# Patient Record
Sex: Female | Born: 1980 | Hispanic: Yes | Marital: Married | Smoking: Never smoker
Health system: Southern US, Community
[De-identification: ages and names within clinical notes are randomized; demographics above are authoritative.]

## PROBLEM LIST (undated history)

## (undated) ENCOUNTER — Inpatient Hospital Stay (HOSPITAL_COMMUNITY): Payer: Self-pay

## (undated) DIAGNOSIS — B89 Unspecified parasitic disease: Secondary | ICD-10-CM

## (undated) HISTORY — PX: APPENDECTOMY: SHX54

## (undated) HISTORY — DX: Unspecified parasitic disease: B89

---

## 2011-06-25 ENCOUNTER — Emergency Department (HOSPITAL_COMMUNITY)
Admission: EM | Admit: 2011-06-25 | Discharge: 2011-06-26 | Disposition: A | Discharge status: Home / Self Care | Payer: PRIVATE HEALTH INSURANCE | Attending: Emergency Medicine | Admitting: Emergency Medicine

## 2011-06-25 ENCOUNTER — Encounter (HOSPITAL_COMMUNITY): Payer: Self-pay | Admitting: *Deleted

## 2011-06-25 DIAGNOSIS — IMO0001 Reserved for inherently not codable concepts without codable children: Secondary | ICD-10-CM | POA: Insufficient documentation

## 2011-06-25 DIAGNOSIS — M542 Cervicalgia: Secondary | ICD-10-CM | POA: Insufficient documentation

## 2011-06-25 DIAGNOSIS — R509 Fever, unspecified: Secondary | ICD-10-CM | POA: Insufficient documentation

## 2011-06-25 DIAGNOSIS — R05 Cough: Secondary | ICD-10-CM | POA: Insufficient documentation

## 2011-06-25 DIAGNOSIS — R5381 Other malaise: Secondary | ICD-10-CM | POA: Insufficient documentation

## 2011-06-25 DIAGNOSIS — R070 Pain in throat: Secondary | ICD-10-CM | POA: Insufficient documentation

## 2011-06-25 DIAGNOSIS — R110 Nausea: Secondary | ICD-10-CM | POA: Insufficient documentation

## 2011-06-25 DIAGNOSIS — R059 Cough, unspecified: Secondary | ICD-10-CM | POA: Insufficient documentation

## 2011-06-25 DIAGNOSIS — J069 Acute upper respiratory infection, unspecified: Secondary | ICD-10-CM | POA: Insufficient documentation

## 2011-06-25 DIAGNOSIS — M2550 Pain in unspecified joint: Secondary | ICD-10-CM | POA: Insufficient documentation

## 2011-06-25 DIAGNOSIS — R51 Headache: Secondary | ICD-10-CM | POA: Insufficient documentation

## 2011-06-25 DIAGNOSIS — J3489 Other specified disorders of nose and nasal sinuses: Secondary | ICD-10-CM | POA: Insufficient documentation

## 2011-06-25 DIAGNOSIS — R5383 Other fatigue: Secondary | ICD-10-CM | POA: Insufficient documentation

## 2011-06-25 DIAGNOSIS — R630 Anorexia: Secondary | ICD-10-CM | POA: Insufficient documentation

## 2011-06-25 DIAGNOSIS — H9209 Otalgia, unspecified ear: Secondary | ICD-10-CM | POA: Insufficient documentation

## 2011-06-25 MED ORDER — MORPHINE SULFATE 4 MG/ML IJ SOLN
6.0000 mg | Freq: Once | INTRAMUSCULAR | Status: AC
  Administered 2011-06-26: 6 mg via INTRAVENOUS
  Filled 2011-06-25: qty 2

## 2011-06-25 MED ORDER — ONDANSETRON HCL 4 MG/2ML IJ SOLN
4.0000 mg | Freq: Once | INTRAMUSCULAR | Status: AC
  Administered 2011-06-26: 4 mg via INTRAVENOUS
  Filled 2011-06-25: qty 2

## 2011-06-25 MED ORDER — SODIUM CHLORIDE 0.9 % IV BOLUS (SEPSIS)
2000.0000 mL | Freq: Once | INTRAVENOUS | Status: AC
  Administered 2011-06-26: 2000 mL via INTRAVENOUS

## 2011-06-25 NOTE — ED Notes (Signed)
Pt has had headache, fever, productive cough, and malaise for the past 24 hours.  Pt had been in [place] and was exposed to [place] fever.  Pt reported a temperature of 103F at 1800 and took 1000mg  of tylenol to bring it down.  Temperature now 99.8.

## 2011-06-25 NOTE — ED Notes (Signed)
Her last tylenol was 3-4 hours ago

## 2011-06-25 NOTE — ED Notes (Signed)
Elevated temp cough headache for 24 hours and her vision is blurred

## 2011-06-25 NOTE — ED Provider Notes (Signed)
History     CSN: [id number]  Arrival date & time 06/25/11  2055   First MD Initiated Contact with Patient 06/25/11 2312      Chief Complaint  Patient presents with  . Influenza    (Consider location/radiation/quality/duration/timing/severity/associated sxs/prior treatment)  Patient is a 31 y.o. female presenting with flu symptoms. The history is provided by the patient. No language interpreter was used.  Influenza Associated symptoms include arthralgias, chills, congestion, coughing, fatigue, a fever, headaches, myalgias and nausea. Pertinent negatives include no abdominal pain, diaphoresis, rash or vomiting.   Patient presents with headache, fever, and cough that began about 24 hours ago. She describes the cough as productive of green sputum. She reports her fever has been up to 103 at home. She has been taking Tylenol prn for fever. She describes the headache as 10/10 and bilateral, temporal. She also reports some sore throat, congestion, neck tenderness, fatigue, nausea, poor appetite, and arthralgias. She denies vomiting, abdominal pain, rash, and any bleeding. She recently returned from a trip to [place].   History reviewed. No pertinent past medical history.  History reviewed. No pertinent past surgical history.  History reviewed. No pertinent family history.  History  Substance Use Topics  . Smoking status: Never Smoker   . Smokeless tobacco: Not on file  . Alcohol Use: Yes    OB History    Grav Para Term Preterm Abortions TAB SAB Ect Mult Living                  Review of Systems  Constitutional: Positive for fever, chills, activity change, appetite change and fatigue. Negative for diaphoresis.  HENT: Positive for congestion and rhinorrhea. Negative for ear pain and nosebleeds.   Respiratory: Positive for cough. Negative for chest tightness and shortness of breath.   Gastrointestinal: Positive for nausea. Negative for vomiting, abdominal pain, diarrhea and  constipation.  Musculoskeletal: Positive for myalgias and arthralgias.  Skin: Negative for rash.  Neurological: Positive for headaches. Negative for dizziness and light-headedness.    Allergies  Ibuprofen  Home Medications   Current Outpatient Rx  Name Route Sig Dispense Refill  . ACETAMINOPHEN 500 MG PO TABS Oral Take 1,000 mg by mouth every 6 (six) hours as needed. For pain    . CORTIZONE-10 EX Apply externally Apply 1 application topically 2 (two) times daily as needed. For itching    . ADULT MULTIVITAMIN W/MINERALS CH Oral Take 1 tablet by mouth daily.    Marland Kitchen OVER THE COUNTER MEDICATION Oral Take 2 tablets by mouth daily. Whole foods immune system booster.    Marland Kitchen OVER THE COUNTER MEDICATION Both Eyes Place 3 drops into both eyes 2 (two) times daily as needed. For dry eyes      BP 110/65  Pulse 87  Temp(Src) 99.8 F (37.7 C) (Oral)  Resp 19  SpO2 97%  Physical Exam  Constitutional: She is oriented to person, place, and time. She appears well-developed and well-nourished. No distress.  HENT:  Head: Normocephalic and atraumatic.  Right Ear: There is tenderness. Tympanic membrane is erythematous.  Left Ear: Tympanic membrane and ear canal normal.  Nose: Nose normal.  Mouth/Throat: Oropharynx is clear and moist and mucous membranes are normal.  Eyes: Conjunctivae are normal. Pupils are equal, round, and reactive to light.  Neck: Normal range of motion. Neck supple.  Cardiovascular: Normal rate, regular rhythm, normal heart sounds and intact distal pulses.   No murmur heard. Pulmonary/Chest: Effort normal and breath sounds normal. No respiratory  distress. She has no wheezes.  Abdominal: Soft. Bowel sounds are normal.  Musculoskeletal: Normal range of motion.  Neurological: She is alert and oriented to person, place, and time.  Skin: Skin is warm and dry. No rash noted. She is not diaphoretic.  Psychiatric: She has a normal mood and affect. Her behavior is normal.    ED Course   Procedures (including critical care time)    Patient is feeling better following 2 L of fluid and pain control.  She was sent home with further pain control comes in for cough.  She is advised to return here for any worsening in her condition.  She states that she fell or has pain or body aches.  The patient is told to increase her fluids at home.   Patient most likely has a viral URI based on her physical exam and history of present illness.   MDM  MDM Reviewed: nursing note and vitals Interpretation: labs and x-ray            Carlyle Dolly, PA-C 06/26/11 [phone]

## 2011-06-26 ENCOUNTER — Emergency Department (HOSPITAL_COMMUNITY): Payer: PRIVATE HEALTH INSURANCE

## 2011-06-26 LAB — BASIC METABOLIC PANEL
BUN: 8 mg/dL (ref 6–23)
CO2: 23 mEq/L (ref 19–32)
Calcium: 9 mg/dL (ref 8.4–10.5)
Chloride: 104 mEq/L (ref 96–112)
Creatinine, Ser: 0.67 mg/dL (ref 0.50–1.10)
GFR calc Af Amer: >90 mL/min (ref >90)
GFR calc non Af Amer: >90 mL/min (ref >90)
Glucose, Bld: 88 mg/dL (ref 70–99)
Potassium: 3 mEq/L — ABNORMAL LOW (ref 3.5–5.1)
Sodium: 139 mEq/L (ref 135–145)

## 2011-06-26 LAB — URINE MICROSCOPIC-ADD ON

## 2011-06-26 LAB — URINALYSIS, ROUTINE W REFLEX MICROSCOPIC
Bilirubin Urine: NEGATIVE
Glucose, UA: NEGATIVE mg/dL
Ketones, ur: >80 mg/dL — AB
Leukocytes, UA: NEGATIVE
Nitrite: NEGATIVE
Protein, ur: NEGATIVE mg/dL
Specific Gravity, Urine: 1.026 (ref 1.005–1.030)
Urobilinogen, UA: 0.2 mg/dL (ref 0.0–1.0)
pH: 6 (ref 5.0–8.0)

## 2011-06-26 LAB — CBC
HCT: 37.8 % (ref 36.0–46.0)
Hemoglobin: 12.9 g/dL (ref 12.0–15.0)
MCH: 29.9 pg (ref 26.0–34.0)
MCHC: 34.1 g/dL (ref 30.0–36.0)
MCV: 87.5 fL (ref 78.0–100.0)
Platelets: 255 10*3/uL (ref 150–400)
RBC: 4.32 MIL/uL (ref 3.87–5.11)
RDW: 12.2 % (ref 11.5–15.5)
WBC: 6.1 10*3/uL (ref 4.0–10.5)

## 2011-06-26 MED ORDER — ACETAMINOPHEN-CODEINE 120-12 MG/5ML PO SOLN: 10.0000 mL | ORAL | Status: AC | PRN

## 2011-06-26 MED ORDER — ACETAMINOPHEN 325 MG PO TABS
ORAL_TABLET | ORAL | Status: AC
  Administered 2011-06-26: 975 mg
  Filled 2011-06-26: qty 3

## 2011-06-26 MED ORDER — GUAIFENESIN ER 1200 MG PO TB12: 1.0000 | ORAL_TABLET | Freq: Two times a day (BID) | ORAL | Status: DC

## 2011-06-26 MED ORDER — ACETAMINOPHEN 325 MG PO TABS: 975.0000 mg | ORAL_TABLET | Freq: Once | ORAL | Status: AC

## 2011-06-26 MED ORDER — POTASSIUM CHLORIDE 20 MEQ/15ML (10%) PO LIQD
40.0000 meq | Freq: Once | ORAL | Status: AC
  Administered 2011-06-26: 40 meq via ORAL
  Filled 2011-06-26: qty 15

## 2011-06-26 NOTE — ED Provider Notes (Signed)
Medical screening examination/treatment/procedure(s) were conducted as a shared visit with non-physician practitioner(s) and myself.  I personally evaluated the patient during the encounter  Feels better at this time. RRR. Not c/o UTI sx. On menstrual cycle. Urine contaminated. Culture sent. Hold abx at this time. Precautions for return.  Forbes Cellar, MD 06/26/11 [phone]

## 2011-06-26 NOTE — ED Notes (Signed)
Pt is stable and in no acute distress.  She is going home with family

## 2011-06-26 NOTE — ED Notes (Signed)
Pt still unable to urinate.  Pt states she has had little to eat or drink in the past 24 hours

## 2011-06-27 LAB — URINE CULTURE
Colony Count: NO GROWTH
Culture  Setup Time: 201301291001
Culture: NO GROWTH

## 2014-06-04 DIAGNOSIS — B89 Unspecified parasitic disease: Secondary | ICD-10-CM

## 2014-06-04 HISTORY — DX: Unspecified parasitic disease: B89

## 2015-03-04 LAB — OB RESULTS CONSOLE GC/CHLAMYDIA: Gonorrhea: NEGATIVE

## 2015-04-04 ENCOUNTER — Ambulatory Visit (INDEPENDENT_AMBULATORY_CARE_PROVIDER_SITE_OTHER): Payer: BLUE CROSS/BLUE SHIELD | Admitting: Obstetrics & Gynecology

## 2015-04-04 ENCOUNTER — Encounter: Payer: Self-pay | Admitting: Obstetrics & Gynecology

## 2015-04-04 VITALS — BP 112/66 | HR 69 | Ht 67.0 in | Wt 176.0 lb

## 2015-04-04 DIAGNOSIS — K589 Irritable bowel syndrome without diarrhea: Secondary | ICD-10-CM | POA: Insufficient documentation

## 2015-04-04 DIAGNOSIS — Z113 Encounter for screening for infections with a predominantly sexual mode of transmission: Secondary | ICD-10-CM

## 2015-04-04 DIAGNOSIS — Z124 Encounter for screening for malignant neoplasm of cervix: Secondary | ICD-10-CM

## 2015-04-04 DIAGNOSIS — Z3481 Encounter for supervision of other normal pregnancy, first trimester: Secondary | ICD-10-CM | POA: Diagnosis not present

## 2015-04-04 DIAGNOSIS — Z3400 Encounter for supervision of normal first pregnancy, unspecified trimester: Secondary | ICD-10-CM | POA: Insufficient documentation

## 2015-04-04 DIAGNOSIS — Z3401 Encounter for supervision of normal first pregnancy, first trimester: Secondary | ICD-10-CM

## 2015-04-04 NOTE — Progress Notes (Signed)
   Subjective:    Bianca Hodge is a G2P0010 8273w6d being seen today for her first obstetrical visit.  Her obstetrical history is significant for h/o GI parasites, sx of IBS. Patient does intend to breast feed. Pregnancy history fully reviewed.  Patient reports nausea and vomiting.  Filed Vitals:   04/04/15 0950 04/04/15 0951  BP: 112/66   Pulse: 69   Height:  5\' 7"  (1.702 m)  Weight: 176 lb (79.833 kg)     HISTORY: OB History  Gravida Para Term Preterm AB SAB TAB Ectopic Multiple Living  2    1  1        # Outcome Date GA Lbr Len/2nd Weight Sex Delivery Anes PTL Lv  2 Current           1 TAB 2003             Past Medical History  Diagnosis Date  . Parasite infection 06-04-2014   Past Surgical History  Procedure Laterality Date  . Appendectomy     No family history on file.   Exam    Uterus:     Pelvic Exam:    Perineum: No Hemorrhoids   Vulva: normal   Vagina:  normal mucosa   pH:    Cervix: no lesions   Adnexa: normal adnexa   Bony Pelvis: average  System: Breast:  normal appearance, no masses or tenderness   Skin: normal coloration and turgor, no rashes    Neurologic: oriented, normal mood   Extremities: normal strength, tone, and muscle mass   HEENT neck supple with midline trachea and thyroid without masses   Mouth/Teeth dental hygiene good   Neck supple   Cardiovascular: regular rate and rhythm   Respiratory:  appears well, vitals normal, no respiratory distress, acyanotic, normal RR   Abdomen: soft, non-tender; bowel sounds normal; no masses,  no organomegaly   Urinary: urethral meatus normal      Assessment:    Pregnancy: G2P0010 Patient Active Problem List   Diagnosis Date Noted  . Irritable bowel syndrome (IBS) 04/04/2015  . Supervision of low-risk first pregnancy 04/04/2015        Plan:     Initial labs drawn. Prenatal vitamins. Problem list reviewed and updated. Genetic Screening discussed First Screen: ordered.Discussed CFDNA  testing  Ultrasound discussed; fetal survey: 18-20 weeks.  Follow up in 4 weeks. 50% of 30 min visit spent on counseling and coordination of care.  Psyllium ok for constipation, probiotics   [facility] 04/04/2015

## 2015-04-04 NOTE — Progress Notes (Signed)
Bedside ultrasound done. CRL 1.52 cm with gestational age of [redacted] weeks 6 days- consistent with LMP. Heart rate measured at 166 bpm.

## 2015-04-05 LAB — OBSTETRIC PANEL
Antibody Screen: NEGATIVE
Basophils Absolute: 0 10*3/uL (ref 0.0–0.1)
Basophils Relative: 0 % (ref 0–1)
Eosinophils Absolute: 0.1 10*3/uL (ref 0.0–0.7)
Eosinophils Relative: 1 % (ref 0–5)
HCT: 36.5 % (ref 36.0–46.0)
Hemoglobin: 12.2 g/dL (ref 12.0–15.0)
Hepatitis B Surface Ag: NEGATIVE
Lymphocytes Relative: 24 % (ref 12–46)
Lymphs Abs: 2 10*3/uL (ref 0.7–4.0)
MCH: 29.8 pg (ref 26.0–34.0)
MCHC: 33.4 g/dL (ref 30.0–36.0)
MCV: 89 fL (ref 78.0–100.0)
MPV: 11.3 fL (ref 8.6–12.4)
Monocytes Absolute: 0.6 10*3/uL (ref 0.1–1.0)
Monocytes Relative: 7 % (ref 3–12)
Neutro Abs: 5.6 10*3/uL (ref 1.7–7.7)
Neutrophils Relative %: 68 % (ref 43–77)
Platelets: 320 10*3/uL (ref 150–400)
RBC: 4.1 MIL/uL (ref 3.87–5.11)
RDW: 13 % (ref 11.5–15.5)
Rh Type: POSITIVE
Rubella: 0.82 Index (ref <0.90)
WBC: 8.2 10*3/uL (ref 4.0–10.5)

## 2015-04-05 LAB — GC/CHLAMYDIA PROBE AMP (~~LOC~~) NOT AT ARMC
Chlamydia: NEGATIVE
Neisseria Gonorrhea: NEGATIVE

## 2015-04-05 LAB — HIV ANTIBODY (ROUTINE TESTING W REFLEX): HIV 1&2 Ab, 4th Generation: NONREACTIVE

## 2015-04-05 LAB — SICKLE CELL SCREEN: Sickle Cell Screen: NEGATIVE

## 2015-04-06 LAB — CULTURE, URINE COMPREHENSIVE
Colony Count: NO GROWTH
Organism ID, Bacteria: NO GROWTH

## 2015-04-07 LAB — CYTOLOGY - PAP

## 2015-04-08 LAB — CYSTIC FIBROSIS DIAGNOSTIC STUDY

## 2015-04-28 ENCOUNTER — Ambulatory Visit (INDEPENDENT_AMBULATORY_CARE_PROVIDER_SITE_OTHER): Payer: BLUE CROSS/BLUE SHIELD | Admitting: Student

## 2015-04-28 VITALS — BP 108/63 | HR 60 | Temp 98.7°F | Wt 176.0 lb

## 2015-04-28 DIAGNOSIS — O99891 Other specified diseases and conditions complicating pregnancy: Secondary | ICD-10-CM | POA: Insufficient documentation

## 2015-04-28 DIAGNOSIS — O09899 Supervision of other high risk pregnancies, unspecified trimester: Secondary | ICD-10-CM | POA: Insufficient documentation

## 2015-04-28 DIAGNOSIS — Z3483 Encounter for supervision of other normal pregnancy, third trimester: Secondary | ICD-10-CM | POA: Diagnosis not present

## 2015-04-28 DIAGNOSIS — Z3401 Encounter for supervision of normal first pregnancy, first trimester: Secondary | ICD-10-CM

## 2015-04-28 DIAGNOSIS — Z2839 Other underimmunization status: Secondary | ICD-10-CM | POA: Insufficient documentation

## 2015-04-28 DIAGNOSIS — O9989 Other specified diseases and conditions complicating pregnancy, childbirth and the puerperium: Secondary | ICD-10-CM

## 2015-04-28 DIAGNOSIS — Z283 Underimmunization status: Secondary | ICD-10-CM

## 2015-04-28 LAB — POCT URINALYSIS DIP (DEVICE)
Bilirubin Urine: NEGATIVE
Glucose, UA: NEGATIVE mg/dL
Hgb urine dipstick: NEGATIVE
Ketones, ur: NEGATIVE mg/dL
Leukocytes, UA: NEGATIVE
Nitrite: NEGATIVE
Protein, ur: NEGATIVE mg/dL
Specific Gravity, Urine: 1.025 (ref 1.005–1.030)
Urobilinogen, UA: 0.2 mg/dL (ref 0.0–1.0)
pH: 7.5 (ref 5.0–8.0)

## 2015-04-28 NOTE — Patient Instructions (Addendum)
Safe Medications in Pregnancy   Acne: Benzoyl Peroxide Salicylic Acid  Backache/Headache: Tylenol: 2 regular strength every 4 hours OR              2 Extra strength every 6 hours  Colds/Coughs/Allergies: Benadryl (alcohol free) 25 mg every 6 hours as needed Breath right strips Claritin Cepacol throat lozenges Chloraseptic throat spray Cold-Eeze- up to three times per day Cough drops, alcohol free Flonase (by prescription only) Guaifenesin Mucinex Robitussin DM (plain only, alcohol free) Saline nasal spray/drops Sudafed (pseudoephedrine) & Actifed ** use only after [redacted] weeks gestation and if you do not have high blood pressure Tylenol Vicks Vaporub Zinc lozenges Zyrtec   Constipation: Colace Ducolax suppositories Fleet enema Glycerin suppositories Metamucil Milk of magnesia Miralax Senokot Smooth move tea  Diarrhea: Kaopectate Imodium A-D  *NO pepto Bismol  Hemorrhoids: Anusol Anusol HC Preparation H Tucks  Indigestion: Tums Maalox Mylanta Zantac  Pepcid  Insomnia: Benadryl (alcohol free) 25mg every 6 hours as needed Tylenol PM Unisom, no Gelcaps  Leg Cramps: Tums MagGel  Nausea/Vomiting:  Bonine Dramamine Emetrol Ginger extract Sea bands Meclizine  Nausea medication to take during pregnancy:  Unisom (doxylamine succinate 25 mg tablets) Take one tablet daily at bedtime. If symptoms are not adequately controlled, the dose can be increased to a maximum recommended dose of two tablets daily (1/2 tablet in the morning, 1/2 tablet mid-afternoon and one at bedtime). Vitamin B6 100mg tablets. Take one tablet twice a day (up to 200 mg per day).  Skin Rashes: Aveeno products Benadryl cream or 25mg every 6 hours as needed Calamine Lotion 1% cortisone cream  Yeast infection: Gyne-lotrimin 7 Monistat 7   **If taking multiple medications, please check labels to avoid duplicating the same active ingredients **take medication as directed on  the label ** Do not exceed 4000 mg of tylenol in 24 hours **Do not take medications that contain aspirin or ibuprofen    First Trimester of Pregnancy The first trimester of pregnancy is from week 1 until the end of week 12 (months 1 through 3). A week after a sperm fertilizes an egg, the egg will implant on the wall of the uterus. This embryo will begin to develop into a baby. Genes from you and your partner are forming the baby. The female genes determine whether the baby is a boy or a girl. At 6-8 weeks, the eyes and face are formed, and the heartbeat can be seen on ultrasound. At the end of 12 weeks, all the baby's organs are formed.  Now that you are pregnant, you will want to do everything you can to have a healthy baby. Two of the most important things are to get good prenatal care and to follow your health care provider's instructions. Prenatal care is all the medical care you receive before the baby's birth. This care will help prevent, find, and treat any problems during the pregnancy and childbirth. BODY CHANGES Your body goes through many changes during pregnancy. The changes vary from woman to woman.   You may gain or lose a couple of pounds at first.  You may feel sick to your stomach (nauseous) and throw up (vomit). If the vomiting is uncontrollable, call your health care provider.  You may tire easily.  You may develop headaches that can be relieved by medicines approved by your health care provider.  You may urinate more often. Painful urination may mean you have a bladder infection.  You may develop heartburn as a result of your   pregnancy.  You may develop constipation because certain hormones are causing the muscles that push waste through your intestines to slow down.  You may develop hemorrhoids or swollen, bulging veins (varicose veins).  Your breasts may begin to grow larger and become tender. Your nipples may stick out more, and the tissue that surrounds them (areola)  may become darker.  Your gums may bleed and may be sensitive to brushing and flossing.  Dark spots or blotches (chloasma, mask of pregnancy) may develop on your face. This will likely fade after the baby is born.  Your menstrual periods will stop.  You may have a loss of appetite.  You may develop cravings for certain kinds of food.  You may have changes in your emotions from day to day, such as being excited to be pregnant or being concerned that something may go wrong with the pregnancy and baby.  You may have more vivid and strange dreams.  You may have changes in your hair. These can include thickening of your hair, rapid growth, and changes in texture. Some women also have hair loss during or after pregnancy, or hair that feels dry or thin. Your hair will most likely return to normal after your baby is born. WHAT TO EXPECT AT YOUR PRENATAL VISITS During a routine prenatal visit:  You will be weighed to make sure you and the baby are growing normally.  Your blood pressure will be taken.  Your abdomen will be measured to track your baby's growth.  The fetal heartbeat will be listened to starting around week 10 or 12 of your pregnancy.  Test results from any previous visits will be discussed. Your health care provider may ask you:  How you are feeling.  If you are feeling the baby move.  If you have had any abnormal symptoms, such as leaking fluid, bleeding, severe headaches, or abdominal cramping.  If you are using any tobacco products, including cigarettes, chewing tobacco, and electronic cigarettes.  If you have any questions. Other tests that may be performed during your first trimester include:  Blood tests to find your blood type and to check for the presence of any previous infections. They will also be used to check for low iron levels (anemia) and Rh antibodies. Later in the pregnancy, blood tests for diabetes will be done along with other tests if problems  develop.  Urine tests to check for infections, diabetes, or protein in the urine.  An ultrasound to confirm the proper growth and development of the baby.  An amniocentesis to check for possible genetic problems.  Fetal screens for spina bifida and Down syndrome.  You may need other tests to make sure you and the baby are doing well.  HIV (human immunodeficiency virus) testing. Routine prenatal testing includes screening for HIV, unless you choose not to have this test. HOME CARE INSTRUCTIONS  Medicines  Follow your health care provider's instructions regarding medicine use. Specific medicines may be either safe or unsafe to take during pregnancy.  Take your prenatal vitamins as directed.  If you develop constipation, try taking a stool softener if your health care provider approves. Diet  Eat regular, well-balanced meals. Choose a variety of foods, such as meat or vegetable-based protein, fish, milk and low-fat dairy products, vegetables, fruits, and whole grain breads and cereals. Your health care provider will help you determine the amount of weight gain that is right for you.  Avoid raw meat and uncooked cheese. These carry germs that can cause   birth defects in the baby.  Eating four or five small meals rather than three large meals a day may help relieve nausea and vomiting. If you start to feel nauseous, eating a few soda crackers can be helpful. Drinking liquids between meals instead of during meals also seems to help nausea and vomiting.  If you develop constipation, eat more high-fiber foods, such as fresh vegetables or fruit and whole grains. Drink enough fluids to keep your urine clear or pale yellow. Activity and Exercise  Exercise only as directed by your health care provider. Exercising will help you:  Control your weight.  Stay in shape.  Be prepared for labor and delivery.  Experiencing pain or cramping in the lower abdomen or low back is a good sign that you  should stop exercising. Check with your health care provider before continuing normal exercises.  Try to avoid standing for long periods of time. Move your legs often if you must stand in one place for a long time.  Avoid heavy lifting.  Wear low-heeled shoes, and practice good posture.  You may continue to have sex unless your health care provider directs you otherwise. Relief of Pain or Discomfort  Wear a good support bra for breast tenderness.   Take warm sitz baths to soothe any pain or discomfort caused by hemorrhoids. Use hemorrhoid cream if your health care provider approves.   Rest with your legs elevated if you have leg cramps or low back pain.  If you develop varicose veins in your legs, wear support hose. Elevate your feet for 15 minutes, 3-4 times a day. Limit salt in your diet. Prenatal Care  Schedule your prenatal visits by the twelfth week of pregnancy. They are usually scheduled monthly at first, then more often in the last 2 months before delivery.  Write down your questions. Take them to your prenatal visits.  Keep all your prenatal visits as directed by your health care provider. Safety  Wear your seat belt at all times when driving.  Make a list of emergency phone numbers, including numbers for family, friends, the hospital, and police and fire departments. General Tips  Ask your health care provider for a referral to a local prenatal education class. Begin classes no later than at the beginning of month 6 of your pregnancy.  Ask for help if you have counseling or nutritional needs during pregnancy. Your health care provider can offer advice or refer you to specialists for help with various needs.  Do not use hot tubs, steam rooms, or saunas.  Do not douche or use tampons or scented sanitary pads.  Do not cross your legs for long periods of time.  Avoid cat litter boxes and soil used by cats. These carry germs that can cause birth defects in the baby  and possibly loss of the fetus by miscarriage or stillbirth.  Avoid all smoking, herbs, alcohol, and medicines not prescribed by your health care provider. Chemicals in these affect the formation and growth of the baby.  Do not use any tobacco products, including cigarettes, chewing tobacco, and electronic cigarettes. If you need help quitting, ask your health care provider. You may receive counseling support and other resources to help you quit.  Schedule a dentist appointment. At home, brush your teeth with a soft toothbrush and be gentle when you floss. SEEK MEDICAL CARE IF:   You have dizziness.  You have mild pelvic cramps, pelvic pressure, or nagging pain in the abdominal area.  You have persistent   nausea, vomiting, or diarrhea.  You have a bad smelling vaginal discharge.  You have pain with urination.  You notice increased swelling in your face, hands, legs, or ankles. SEEK IMMEDIATE MEDICAL CARE IF:   You have a fever.  You are leaking fluid from your vagina.  You have spotting or bleeding from your vagina.  You have severe abdominal cramping or pain.  You have rapid weight gain or loss.  You vomit blood or material that looks like coffee grounds.  You are exposed to German measles and have never had them.  You are exposed to fifth disease or chickenpox.  You develop a severe headache.  You have shortness of breath.  You have any kind of trauma, such as from a fall or a car accident.   This information is not intended to replace advice given to you by your health care provider. Make sure you discuss any questions you have with your health care provider.   Document Released: 05/08/2001 Document Revised: 06/04/2014 Document Reviewed: 03/24/2013 Elsevier Interactive Patient Education 2016 [employer]  

## 2015-04-28 NOTE — Progress Notes (Signed)
Subjective:  Bianca Hodge is a 34 y.o. G2P0010 at 9138w2d being seen today for ongoing prenatal care.  She is currently monitored for the following issues for this low-risk pregnancy and has Irritable bowel syndrome (IBS); Supervision of low-risk first pregnancy; and Rubella non-immune status, antepartum on her problem list.  Patient reports no complaints.  Contractions: Not present. Vag. Bleeding: None.  Movement: Present. Denies leaking of fluid.   The following portions of the patient's history were reviewed and updated as appropriate: allergies, current medications, past family history, past medical history, past social history, past surgical history and problem list. Problem list updated.  Objective:   Filed Vitals:   04/28/15 1521  BP: 108/63  Pulse: 60  Temp: 98.7 F (37.1 C)  Weight: 176 lb (79.833 kg)    Fetal Status: Fetal Heart Rate (bpm): 160   Movement: Present     General:  Alert, oriented and cooperative. Patient is in no acute distress.  Skin: Skin is warm and dry. No rash noted.   Cardiovascular: Normal heart rate noted  Respiratory: Normal respiratory effort, no problems with respiration noted  Abdomen: Soft, gravid, appropriate for gestational age. Pain/Pressure: Absent     Pelvic: Vag. Bleeding: None     Cervical exam deferred        Extremities: Normal range of motion.  Edema: None  Mental Status: Normal mood and affect. Normal behavior. Normal judgment and thought content.   Urinalysis: Urine Protein: Negative Urine Glucose: Negative  Assessment and Plan:  Pregnancy: G2P0010 at 6738w2d  1. Encounter for supervision of normal first pregnancy in first trimester  Preterm labor symptoms and general obstetric precautions including but not limited to vaginal bleeding, contractions, leaking of fluid and fetal movement were reviewed in detail with the patient. Please refer to After Visit Summary for other counseling recommendations.  Return in about 4 weeks (around  05/26/2015).   Judeth HornErin Lizandro Spellman, NP

## 2015-05-06 ENCOUNTER — Encounter (HOSPITAL_COMMUNITY): Payer: Self-pay

## 2015-05-06 ENCOUNTER — Other Ambulatory Visit: Payer: Self-pay | Admitting: Obstetrics & Gynecology

## 2015-05-06 ENCOUNTER — Ambulatory Visit (HOSPITAL_COMMUNITY)
Admission: RE | Admit: 2015-05-06 | Discharge: 2015-05-06 | Disposition: A | Discharge status: Home / Self Care | Payer: BLUE CROSS/BLUE SHIELD | Source: Ambulatory Visit | Attending: Advanced Practice Midwife | Admitting: Advanced Practice Midwife

## 2015-05-06 DIAGNOSIS — Z3400 Encounter for supervision of normal first pregnancy, unspecified trimester: Secondary | ICD-10-CM

## 2015-05-06 DIAGNOSIS — O2690 Pregnancy related conditions, unspecified, unspecified trimester: Secondary | ICD-10-CM

## 2015-05-06 DIAGNOSIS — Z36 Encounter for antenatal screening of mother: Secondary | ICD-10-CM | POA: Diagnosis not present

## 2015-05-06 DIAGNOSIS — Z369 Encounter for antenatal screening, unspecified: Secondary | ICD-10-CM

## 2015-05-06 DIAGNOSIS — Z3A12 12 weeks gestation of pregnancy: Secondary | ICD-10-CM

## 2015-05-06 DIAGNOSIS — O2691 Pregnancy related conditions, unspecified, first trimester: Secondary | ICD-10-CM | POA: Insufficient documentation

## 2015-05-06 DIAGNOSIS — Z3401 Encounter for supervision of normal first pregnancy, first trimester: Secondary | ICD-10-CM

## 2015-05-13 ENCOUNTER — Encounter: Payer: Self-pay | Admitting: *Deleted

## 2015-05-13 ENCOUNTER — Other Ambulatory Visit (HOSPITAL_COMMUNITY): Payer: Self-pay

## 2015-05-13 DIAGNOSIS — Z3401 Encounter for supervision of normal first pregnancy, first trimester: Secondary | ICD-10-CM

## 2015-05-29 NOTE — L&D Delivery Note (Signed)
Patient is 35 y.o. G2P0010 6253w2d admitted for contraction/latent labor   Delivery Note At 1:07 PM a viable female was delivered via Vaginal, Spontaneous Delivery (Presentation: Left Occiput Anterior).  APGAR: 3, 8; weight  .   Placenta status: Intact, Spontaneous.  Cord: 3 vessels with the following complications: None.  Cord pH: n/a  Anesthesia: Epidural  Episiotomy: None Lacerations: 2nd degree Suture Repair: 3.0 Est. Blood Loss (mL): 150  Mom to postpartum.  Baby to Couplet care / Skin to Skin.  Bianca Hodge 11/10/2015, 1:46 PM  Midwife attestation: I was gloved and present for delivery in its entirety and I agree with the above resident's note.  Donette LarryMelanie Derric Dealmeida, CNM 4:32 PM

## 2015-06-01 ENCOUNTER — Ambulatory Visit (INDEPENDENT_AMBULATORY_CARE_PROVIDER_SITE_OTHER): Payer: 59 | Admitting: Advanced Practice Midwife

## 2015-06-01 VITALS — BP 114/67 | HR 73 | Temp 98.2°F | Wt 179.3 lb

## 2015-06-01 DIAGNOSIS — O219 Vomiting of pregnancy, unspecified: Secondary | ICD-10-CM

## 2015-06-01 DIAGNOSIS — Z3402 Encounter for supervision of normal first pregnancy, second trimester: Secondary | ICD-10-CM | POA: Diagnosis not present

## 2015-06-01 LAB — POCT URINALYSIS DIP (DEVICE)
Bilirubin Urine: NEGATIVE
Glucose, UA: NEGATIVE mg/dL
Hgb urine dipstick: NEGATIVE
Ketones, ur: NEGATIVE mg/dL
Leukocytes, UA: NEGATIVE
Nitrite: NEGATIVE
Protein, ur: NEGATIVE mg/dL
Specific Gravity, Urine: 1.025 (ref 1.005–1.030)
Urobilinogen, UA: 0.2 mg/dL (ref 0.0–1.0)
pH: 5.5 (ref 5.0–8.0)

## 2015-06-01 MED ORDER — PROMETHAZINE HCL 25 MG PO TABS: 25.0000 mg | ORAL_TABLET | Freq: Four times a day (QID) | ORAL | Status: DC | PRN

## 2015-06-01 NOTE — Progress Notes (Signed)
Anatomy US scheduled for January 25th @ 1000

## 2015-06-01 NOTE — Patient Instructions (Signed)

## 2015-06-01 NOTE — Progress Notes (Signed)
Subjective:  Bianca Hodge is a 35 y.o. G2P0010 at 73107w1d being seen today for ongoing prenatal care.  She is currently monitored for the following issues for this low-risk pregnancy and has Irritable bowel syndrome (IBS); Supervision of low-risk first pregnancy; and Rubella non-immune status, antepartum on her problem list.  Patient reports nausea and vomiting twice a day.  Contractions: Not present. Vag. Bleeding: None.  Movement: Present. Denies leaking of fluid.   The following portions of the patient's history were reviewed and updated as appropriate: allergies, current medications, past family history, past medical history, past social history, past surgical history and problem list. Problem list updated.  Objective:   Filed Vitals:   06/01/15 0942  BP: 114/67  Pulse: 73  Temp: 98.2 F (36.8 C)  Weight: 179 lb 4.8 oz (81.33 kg)    Fetal Status: Fetal Heart Rate (bpm): 143   Movement: Present     General:  Alert, oriented and cooperative. Patient is in no acute distress.  Skin: Skin is warm and dry. No rash noted.   Cardiovascular: Normal heart rate noted  Respiratory: Normal respiratory effort, no problems with respiration noted  Abdomen: Soft, gravid, appropriate for gestational age. Pain/Pressure: Absent     Pelvic: Vag. Bleeding: None     Cervical exam deferred        Extremities: Normal range of motion.  Edema: Trace  Mental Status: Normal mood and affect. Normal behavior. Normal judgment and thought content.   Urinalysis: Urine Protein: Negative Urine Glucose: Negative  Assessment and Plan:  Pregnancy: G2P0010 at 48107w1d 1. Nausea and vomiting of pregnancy, antepartum  - promethazine (PHENERGAN) 25 MG tablet; Take 1 tablet (25 mg total) by mouth every 6 (six) hours as needed for nausea or vomiting.  Dispense: 30 tablet; Refill: 1  2. Encounter for supervision of normal first pregnancy in second trimester  - US MFM OB COMP + 14 WK; Future  Preterm labor symptoms and  general obstetric precautions including but not limited to vaginal bleeding, contractions, leaking of fluid and fetal movement were reviewed in detail with the patient. Please refer to After Visit Summary for other counseling recommendations.  F/u 4 weeks   Dorathy KinsmanVirginia Zurii Hewes, [place]

## 2015-06-01 NOTE — Progress Notes (Signed)
  Traveled recently to [place]. C/o cold symptoms-cough , congestion.  Still having morning sickness- has gotten worse since has cold.

## 2015-06-22 ENCOUNTER — Ambulatory Visit (HOSPITAL_COMMUNITY)
Admission: RE | Admit: 2015-06-22 | Discharge: 2015-06-22 | Disposition: A | Discharge status: Home / Self Care | Payer: 59 | Source: Ambulatory Visit | Attending: Advanced Practice Midwife | Admitting: Advanced Practice Midwife

## 2015-06-22 ENCOUNTER — Other Ambulatory Visit: Payer: Self-pay | Admitting: Advanced Practice Midwife

## 2015-06-22 DIAGNOSIS — Z3A19 19 weeks gestation of pregnancy: Secondary | ICD-10-CM

## 2015-06-22 DIAGNOSIS — Z3402 Encounter for supervision of normal first pregnancy, second trimester: Secondary | ICD-10-CM

## 2015-06-22 DIAGNOSIS — Z3689 Encounter for other specified antenatal screening: Secondary | ICD-10-CM

## 2015-06-22 DIAGNOSIS — Z36 Encounter for antenatal screening of mother: Secondary | ICD-10-CM | POA: Insufficient documentation

## 2015-06-29 ENCOUNTER — Ambulatory Visit (INDEPENDENT_AMBULATORY_CARE_PROVIDER_SITE_OTHER): Payer: 59 | Admitting: Certified Nurse Midwife

## 2015-06-29 VITALS — BP 111/67 | HR 74 | Temp 98.2°F | Wt 182.0 lb

## 2015-06-29 DIAGNOSIS — Z3402 Encounter for supervision of normal first pregnancy, second trimester: Secondary | ICD-10-CM | POA: Diagnosis not present

## 2015-06-29 DIAGNOSIS — Z23 Encounter for immunization: Secondary | ICD-10-CM | POA: Diagnosis not present

## 2015-06-29 LAB — POCT URINALYSIS DIP (DEVICE)
Bilirubin Urine: NEGATIVE
Glucose, UA: NEGATIVE mg/dL
Hgb urine dipstick: NEGATIVE
Ketones, ur: NEGATIVE mg/dL
Leukocytes, UA: NEGATIVE
Nitrite: NEGATIVE
Protein, ur: NEGATIVE mg/dL
Specific Gravity, Urine: >=1.03 (ref 1.005–1.030)
Urobilinogen, UA: 0.2 mg/dL (ref 0.0–1.0)
pH: 5 (ref 5.0–8.0)

## 2015-06-29 NOTE — Patient Instructions (Signed)

## 2015-06-29 NOTE — Progress Notes (Signed)
Subjective:  Bianca Hodge is a 35 y.o. G2P0010 at [redacted]w[redacted]d being seen today for ongoing prenatal care.  She is currently monitored for the following issues for this low-risk pregnancy and has Irritable bowel syndrome (IBS); Supervision of low-risk first pregnancy; and Rubella non-immune status, antepartum on her problem list.  Patient reports no complaints.  Contractions: Not present. Vag. Bleeding: None.  Movement: Present. Denies leaking of fluid.   The following portions of the patient's history were reviewed and updated as appropriate: allergies, current medications, past family history, past medical history, past social history, past surgical history and problem list. Problem list updated.  Objective:   Filed Vitals:   06/29/15 0917  BP: 111/67  Pulse: 74  Temp: 98.2 F (36.8 C)  Weight: 182 lb (82.555 kg)    Fetal Status: Fetal Heart Rate (bpm): 143   Movement: Present     General:  Alert, oriented and cooperative. Patient is in no acute distress.  Skin: Skin is warm and dry. No rash noted.   Cardiovascular: Normal heart rate noted  Respiratory: Normal respiratory effort, no problems with respiration noted  Abdomen: Soft, gravid, appropriate for gestational age. Pain/Pressure: Present     Pelvic: Vag. Bleeding: None     Cervical exam deferred        Extremities: Normal range of motion.  Edema: None  Mental Status: Normal mood and affect. Normal behavior. Normal judgment and thought content.   Urinalysis: Urine Protein: Negative Urine Glucose: Negative  Assessment and Plan:  Pregnancy: G2P0010 at [redacted]w[redacted]d  1. Encounter for supervision of normal first pregnancy in second trimester  - Flu Vaccine QUAD 36+ mos IM; Standing - Flu Vaccine QUAD 36+ mos IM  Preterm labor symptoms and general obstetric precautions including but not limited to vaginal bleeding, contractions, leaking of fluid and fetal movement were reviewed in detail with the patient. Please refer to After Visit  Summary for other counseling recommendations.  Return in about 4 weeks (around 07/27/2015).   Rhea Pink, CNM

## 2015-06-29 NOTE — Progress Notes (Signed)
Patient reports occasional pulling sensations  Patient declines AFP

## 2015-07-05 ENCOUNTER — Telehealth: Payer: Self-pay | Admitting: General Practice

## 2015-07-05 NOTE — Telephone Encounter (Signed)
Patient called and left message stating she is a patient here and has a question about an OTC medication. Patient wants to know if it is okay to use a medication for hemorrhoids that is a suppository. Called patient stating I am returning her phone call. Discussed that we prefer she use the hemorrhoid cream rather than the suppository. Also recommended tucks pads or anusol hcl. Patient verbalized understanding & had no other questions

## 2015-07-27 ENCOUNTER — Ambulatory Visit (INDEPENDENT_AMBULATORY_CARE_PROVIDER_SITE_OTHER): Payer: 59 | Admitting: Family Medicine

## 2015-07-27 VITALS — BP 117/57 | HR 63 | Wt 185.1 lb

## 2015-07-27 DIAGNOSIS — O09899 Supervision of other high risk pregnancies, unspecified trimester: Secondary | ICD-10-CM

## 2015-07-27 DIAGNOSIS — O99891 Other specified diseases and conditions complicating pregnancy: Secondary | ICD-10-CM

## 2015-07-27 DIAGNOSIS — Z3402 Encounter for supervision of normal first pregnancy, second trimester: Secondary | ICD-10-CM

## 2015-07-27 DIAGNOSIS — O9989 Other specified diseases and conditions complicating pregnancy, childbirth and the puerperium: Secondary | ICD-10-CM

## 2015-07-27 DIAGNOSIS — Z283 Underimmunization status: Secondary | ICD-10-CM

## 2015-07-27 DIAGNOSIS — Z2839 Other underimmunization status: Secondary | ICD-10-CM

## 2015-07-27 LAB — POCT URINALYSIS DIP (DEVICE)
Bilirubin Urine: NEGATIVE
Glucose, UA: NEGATIVE mg/dL
Hgb urine dipstick: NEGATIVE
Ketones, ur: NEGATIVE mg/dL
Leukocytes, UA: NEGATIVE
Nitrite: NEGATIVE
Protein, ur: NEGATIVE mg/dL
Specific Gravity, Urine: >=1.03 (ref 1.005–1.030)
Urobilinogen, UA: 0.2 mg/dL (ref 0.0–1.0)
pH: 6 (ref 5.0–8.0)

## 2015-07-27 NOTE — Progress Notes (Signed)
Pt reports spraining left ankle getting better

## 2015-07-27 NOTE — Patient Instructions (Addendum)
For breastfeeding - Womanly Art of Breastfeeding - [employer]  Parents, grandparents need whooping cough shot   You have constipation which is hard stools that are difficult to pass. It is important to have regular bowel movements every 1-3 days that are soft and easy to pass. Hard stools increase your risk of hemorrhoids and are very uncomfortable.   To prevent constipation you can increase the amount of fiber in your diet. Examples of foods with fiber are leafy greens, whole grain breads, oatmeal and other grains.  It is also important to drink at least eight 8oz glass of water everyday.   If you have not has a bowel movement in 4-5 days you made need to clean out your bowel.  This will have establish normal movement through your bowel.    Miralax Clean out  Take 8 capfuls of miralax in 64 oz of gatorade  Continue to drink at least eight 8 oz glasses of water throughout the day  You can repeat with another 8 capfuls of miralax in 64 oz of gatorade if you are not having a large amount of stools  You will need to be at home and close to a bathroom for about 8 hours when you do the above as you may need to go to the bathroom frequently.   After you are cleaned out: - Start Colace100mg  twice daily - Start Miralax once daily - Start a daily fiber supplement like metamucil or citrucel - You can safely use enemas in pregnancy  - if you are having diarrhea you can reduce to Colace once a day or miralax every other day or a 1/2 capful daily.   Safe Medications in Pregnancy   Acne: Benzoyl Peroxide Salicylic Acid  Backache/Headache: Tylenol: 2 regular strength every 4 hours OR              2 Extra strength every 6 hours  Colds/Coughs/Allergies: Benadryl (alcohol free) 25 mg every 6 hours as needed Breath right strips Claritin Cepacol throat lozenges Chloraseptic throat spray Cold-Eeze- up to three times per day Cough drops, alcohol free Flonase (by prescription  only) Guaifenesin Mucinex Robitussin DM (plain only, alcohol free) Saline nasal spray/drops Sudafed (pseudoephedrine) & Actifed ** use only after [redacted] weeks gestation and if you do not have high blood pressure Tylenol Vicks Vaporub Zinc lozenges Zyrtec   Constipation: Colace Ducolax suppositories Fleet enema Glycerin suppositories Metamucil Milk of magnesia Miralax Senokot Smooth move tea  Diarrhea: Kaopectate Imodium A-D  *NO pepto Bismol  Hemorrhoids: Anusol Anusol HC Preparation H Tucks  Indigestion: Tums Maalox Mylanta Zantac  Pepcid  Insomnia: Benadryl (alcohol free)  every 6 hours as needed Tylenol PM Unisom, no Gelcaps  Leg Cramps: Tums MagGel  Nausea/Vomiting:  Bonine Dramamine Emetrol Ginger extract Sea bands Meclizine  Nausea medication to take during pregnancy:  Unisom (doxylamine succinate 25 mg tablets) Take one tablet daily at bedtime. If symptoms are not adequately controlled, the dose can be increased to a maximum recommended dose of two tablets daily (1/2 tablet in the morning, 1/2 tablet mid-afternoon and one at bedtime). Vitamin B6  tablets. Take one tablet twice a day (up to 200 mg per day).  Skin Rashes: Aveeno products Benadryl cream or  every 6 hours as needed Calamine Lotion 1% cortisone cream  Yeast infection: Gyne-lotrimin 7 Monistat 7   **If taking multiple medications, please check labels to avoid duplicating the same active ingredients **take medication as directed on the label ** Do not exceed 4000 mg  tylenol in 24 hours **Do not take medications that contain aspirin or ibuprofen    

## 2015-07-27 NOTE — Progress Notes (Signed)
Subjective:  Bianca Hodge is a 35 y.o. G2P0010 at [redacted]w[redacted]d being seen today for ongoing prenatal care.  She is currently monitored for the following issues for this low-risk pregnancy and has Irritable bowel syndrome (IBS); Supervision of low-risk first pregnancy; and Rubella non-immune status, antepartum on her problem list.  Patient reports no complaints.  Contractions: Not present. Vag. Bleeding: None.  Movement: Present. Denies leaking of fluid.   The following portions of the patient's history were reviewed and updated as appropriate: allergies, current medications, past family history, past medical history, past social history, past surgical history and problem list. Problem list updated.  Objective:   Filed Vitals:   07/27/15 1125  BP: 117/57  Pulse: 63  Weight: 185 lb 1.6 oz (83.961 kg)    Fetal Status: Fetal Heart Rate (bpm): 152 Fundal Height: 24 cm Movement: Present     General:  Alert, oriented and cooperative. Patient is in no acute distress.  Skin: Skin is warm and dry. No rash noted.   Cardiovascular: Normal heart rate noted  Respiratory: Normal respiratory effort, no problems with respiration noted  Abdomen: Soft, gravid, appropriate for gestational age. Pain/Pressure: Absent     Pelvic: Vag. Bleeding: None     Cervical exam deferred        Extremities: Normal range of motion.  Edema: None  Mental Status: Normal mood and affect. Normal behavior. Normal judgment and thought content.   Urinalysis: Urine Protein: Negative Urine Glucose: Negative  Assessment and Plan:  Pregnancy: G2P0010 at [redacted]w[redacted]d  1. Encounter for supervision of normal first pregnancy in second trimester -updated box -discussed constipation prevention and provided list of safe medication in pregnancy -reviewed BF resources and services  Preterm labor symptoms and general obstetric precautions including but not limited to vaginal bleeding, contractions, leaking of fluid and fetal movement were  reviewed in detail with the patient. Please refer to After Visit Summary for other counseling recommendations.  Return in about 4 weeks (around 08/24/2015) for Routine prenatal care.   Federico Flake, MD

## 2015-08-31 ENCOUNTER — Encounter: Payer: Self-pay | Admitting: Advanced Practice Midwife

## 2015-08-31 ENCOUNTER — Ambulatory Visit (INDEPENDENT_AMBULATORY_CARE_PROVIDER_SITE_OTHER): Payer: 59 | Admitting: Advanced Practice Midwife

## 2015-08-31 VITALS — BP 102/59 | HR 74 | Wt 190.8 lb

## 2015-08-31 DIAGNOSIS — Z23 Encounter for immunization: Secondary | ICD-10-CM | POA: Diagnosis not present

## 2015-08-31 DIAGNOSIS — Z3403 Encounter for supervision of normal first pregnancy, third trimester: Secondary | ICD-10-CM

## 2015-08-31 LAB — POCT URINALYSIS DIP (DEVICE)
Bilirubin Urine: NEGATIVE
Glucose, UA: NEGATIVE mg/dL
Hgb urine dipstick: NEGATIVE
Ketones, ur: NEGATIVE mg/dL
Leukocytes, UA: NEGATIVE
Nitrite: NEGATIVE
Protein, ur: NEGATIVE mg/dL
Specific Gravity, Urine: 1.015 (ref 1.005–1.030)
Urobilinogen, UA: 0.2 mg/dL (ref 0.0–1.0)
pH: 7.5 (ref 5.0–8.0)

## 2015-08-31 LAB — CBC
HCT: 35.1 % (ref 35.0–45.0)
Hemoglobin: 11.5 g/dL — ABNORMAL LOW (ref 11.7–15.5)
MCH: 29.1 pg (ref 27.0–33.0)
MCHC: 32.8 g/dL (ref 32.0–36.0)
MCV: 88.9 fL (ref 80.0–100.0)
MPV: 11.1 fL (ref 7.5–12.5)
Platelets: 277 10*3/uL (ref 140–400)
RBC: 3.95 MIL/uL (ref 3.80–5.10)
RDW: 13.3 % (ref 11.0–15.0)
WBC: 10.8 10*3/uL (ref 3.8–10.8)

## 2015-08-31 MED ORDER — TETANUS-DIPHTH-ACELL PERTUSSIS 5-2.5-18.5 LF-MCG/0.5 IM SUSP
0.5000 mL | Freq: Once | INTRAMUSCULAR | Status: AC
  Administered 2015-08-31: 0.5 mL via INTRAMUSCULAR

## 2015-08-31 NOTE — Progress Notes (Signed)
Subjective:  Bianca Hodge is a 35 y.o. G2P0010 at 1510w1d being seen today for ongoing prenatal care.  She is currently monitored for the following issues for this low-risk pregnancy and has Irritable bowel syndrome (IBS); Supervision of low-risk first pregnancy; and Rubella non-immune status, antepartum on her problem list.  Patient reports intermittent aches and pains in pelvis.  Contractions: Not present. Vag. Bleeding: None.  Movement: Present. Denies leaking of fluid.   The following portions of the patient's history were reviewed and updated as appropriate: allergies, current medications, past family history, past medical history, past social history, past surgical history and problem list. Problem list updated.  Objective:   Filed Vitals:   08/31/15 1029  BP: 102/59  Pulse: 74  Weight: 190 lb 12.8 oz (86.546 kg)    Fetal Status: Fetal Heart Rate (bpm): 146 Fundal Height: 29 cm Movement: Present     General:  Alert, oriented and cooperative. Patient is in no acute distress.  Skin: Skin is warm and dry. No rash noted.   Cardiovascular: Normal heart rate noted  Respiratory: Normal respiratory effort, no problems with respiration noted  Abdomen: Soft, gravid, appropriate for gestational age. Pain/Pressure: Present     Pelvic: Vag. Bleeding: None Vag D/C Character: Thin   Cervical exam deferred        Extremities: Normal range of motion.  Edema: None  Mental Status: Normal mood and affect. Normal behavior. Normal judgment and thought content.   Urinalysis: Urine Protein: Negative Urine Glucose: Negative  Assessment and Plan:  Pregnancy: G2P0010 at 8410w1d  1. Encounter for supervision of normal first pregnancy in third trimester     Discussed issues around doulas and labor. Wants low intervention birth, no IV, no epidural. May be interested in Nitrous.  Discussed CNMs cannot stay at bedside, may want doula.    - Glucose Tolerance, 1 HR (50g) - RPR - CBC - HIV  antibody  Preterm labor symptoms and general obstetric precautions including but not limited to vaginal bleeding, contractions, leaking of fluid and fetal movement were reviewed in detail with the patient. Please refer to After Visit Summary for other counseling recommendations.  Return in about 2 weeks (around 09/14/2015) for [facility].   Aviva SignsMarie L Jamen Loiseau, CNM

## 2015-08-31 NOTE — Patient Instructions (Signed)

## 2015-08-31 NOTE — Progress Notes (Signed)
Pt has questions about having a Doula or epidural. Pt reports increased pelvic pain which is somewhat relieved by swimming. 28 wks labs today.

## 2015-09-01 LAB — RPR

## 2015-09-01 LAB — GLUCOSE TOLERANCE, 1 HOUR (50G) W/O FASTING: Glucose, 1 Hr, gestational: 96 mg/dL (ref <140)

## 2015-09-01 LAB — HIV ANTIBODY (ROUTINE TESTING W REFLEX): HIV 1&2 Ab, 4th Generation: NONREACTIVE

## 2015-09-13 ENCOUNTER — Ambulatory Visit (INDEPENDENT_AMBULATORY_CARE_PROVIDER_SITE_OTHER): Payer: 59 | Admitting: Student

## 2015-09-13 ENCOUNTER — Encounter: Payer: Self-pay | Admitting: Student

## 2015-09-13 VITALS — BP 119/66 | HR 78 | Wt 194.5 lb

## 2015-09-13 DIAGNOSIS — Z3403 Encounter for supervision of normal first pregnancy, third trimester: Secondary | ICD-10-CM | POA: Diagnosis not present

## 2015-09-13 LAB — POCT URINALYSIS DIP (DEVICE)
Bilirubin Urine: NEGATIVE
Glucose, UA: NEGATIVE mg/dL
Hgb urine dipstick: NEGATIVE
Ketones, ur: NEGATIVE mg/dL
Leukocytes, UA: NEGATIVE
Nitrite: NEGATIVE
Protein, ur: NEGATIVE mg/dL
Specific Gravity, Urine: >=1.03 (ref 1.005–1.030)
Urobilinogen, UA: 0.2 mg/dL (ref 0.0–1.0)
pH: 5.5 (ref 5.0–8.0)

## 2015-09-13 NOTE — Progress Notes (Signed)
Subjective:  Bianca Hodge is a 35 y.o. G2P0010 at 2563w0d being seen today for ongoing prenatal care.  She is currently monitored for the following issues for this low-risk pregnancy and has Irritable bowel syndrome (IBS); Supervision of low-risk first pregnancy; and Rubella non-immune status, antepartum on her problem list.  Patient reports no complaints.  Contractions: Not present. Vag. Bleeding: None.  Movement: Present. Denies leaking of fluid.   The following portions of the patient's history were reviewed and updated as appropriate: allergies, current medications, past family history, past medical history, past social history, past surgical history and problem list. Problem list updated.  Objective:   Filed Vitals:   09/13/15 1030  BP: 119/66  Pulse: 78  Weight: 194 lb 8 oz (88.225 kg)    Fetal Status: Fetal Heart Rate (bpm): 161   Movement: Present     General:  Alert, oriented and cooperative. Patient is in no acute distress.  Skin: Skin is warm and dry. No rash noted.   Cardiovascular: Normal heart rate noted  Respiratory: Normal respiratory effort, no problems with respiration noted  Abdomen: Soft, gravid, appropriate for gestational age. Fundal height 31 cm     Pelvic: Vag. Bleeding: None     Cervical exam deferred        Extremities: Normal range of motion.  Edema: None  Mental Status: Normal mood and affect. Normal behavior. Normal judgment and thought content.   Urinalysis: Urine Protein: Negative Urine Glucose: Negative  Assessment and Plan:  Pregnancy: G2P0010 at 5563w0d  1. Encounter for supervision of normal first pregnancy in third trimester -Questions answered regarding prenatal education, delivery expectations, IOL process, etc.   Preterm labor symptoms and general obstetric precautions including but not limited to vaginal bleeding, contractions, leaking of fluid and fetal movement were reviewed in detail with the patient. Please refer to After Visit Summary  for other counseling recommendations.  Return in about 2 weeks (around 09/27/2015) for Routine OB.   Judeth HornErin Fedor Kazmierski, NP

## 2015-09-13 NOTE — Patient Instructions (Addendum)
Safe Medications in Pregnancy   Acne: Benzoyl Peroxide Salicylic Acid  Backache/Headache: Tylenol: 2 regular strength every 4 hours OR              2 Extra strength every 6 hours  Colds/Coughs/Allergies: Benadryl (alcohol free) 25 mg every 6 hours as needed Breath right strips Claritin Cepacol throat lozenges Chloraseptic throat spray Cold-Eeze- up to three times per day Cough drops, alcohol free Flonase (by prescription only) Guaifenesin Mucinex Robitussin DM (plain only, alcohol free) Saline nasal spray/drops Sudafed (pseudoephedrine) & Actifed ** use only after [redacted] weeks gestation and if you do not have high blood pressure Tylenol Vicks Vaporub Zinc lozenges Zyrtec   Constipation: Colace Ducolax suppositories Fleet enema Glycerin suppositories Metamucil Milk of magnesia Miralax Senokot Smooth move tea  Diarrhea: Kaopectate Imodium A-D  *NO pepto Bismol  Hemorrhoids: Anusol Anusol HC Preparation H Tucks  Indigestion: Tums Maalox Mylanta Zantac  Pepcid  Insomnia: Benadryl (alcohol free) 25mg  every 6 hours as needed Tylenol PM Unisom, no Gelcaps  Leg Cramps: Tums MagGel  Nausea/Vomiting:  Bonine Dramamine Emetrol Ginger extract Sea bands Meclizine  Nausea medication to take during pregnancy:  Unisom (doxylamine succinate 25 mg tablets) Take one tablet daily at bedtime. If symptoms are not adequately controlled, the dose can be increased to a maximum recommended dose of two tablets daily (1/2 tablet in the morning, 1/2 tablet mid-afternoon and one at bedtime). Vitamin B6 100mg  tablets. Take one tablet twice a day (up to 200 mg per day).  Skin Rashes: Aveeno products Benadryl cream or 25mg  every 6 hours as needed Calamine Lotion 1% cortisone cream  Yeast infection: Gyne-lotrimin 7 Monistat 7  Gum/tooth pain: Anbesol  **If taking multiple medications, please check labels to avoid duplicating the same active ingredients **take  medication as directed on the label ** Do not exceed 4000 mg of tylenol in 24 hours **Do not take medications that contain aspirin or ibuprofen           Preterm Labor Information Preterm labor is when labor starts at less than 37 weeks of pregnancy. The normal length of a pregnancy is 39 to 41 weeks. CAUSES Often, there is no identifiable underlying cause as to why a woman goes into preterm labor. One of the most common known causes of preterm labor is infection. Infections of the uterus, cervix, vagina, amniotic sac, bladder, kidney, or even the lungs (pneumonia) can cause labor to start. Other suspected causes of preterm labor include:   Urogenital infections, such as yeast infections and bacterial vaginosis.   Uterine abnormalities (uterine shape, uterine septum, fibroids, or bleeding from the placenta).   A cervix that has been operated on (it may fail to stay closed).   Malformations in the fetus.   Multiple gestations (twins, triplets, and so on).   Breakage of the amniotic sac.  RISK FACTORS  Having a previous history of preterm labor.   Having premature rupture of membranes (PROM).   Having a placenta that covers the opening of the cervix (placenta previa).   Having a placenta that separates from the uterus (placental abruption).   Having a cervix that is too weak to hold the fetus in the uterus (incompetent cervix).   Having too much fluid in the amniotic sac (polyhydramnios).   Taking illegal drugs or smoking while pregnant.   Not gaining enough weight while pregnant.   Being younger than 5818 and older than 35 years old.   Having a low socioeconomic status.   Being African  American. SYMPTOMS Signs and symptoms of preterm labor include:   Menstrual-like cramps, abdominal pain, or back pain.  Uterine contractions that are regular, as frequent as six in an hour, regardless of their intensity (may be mild or painful).  Contractions that  start on the top of the uterus and spread down to the lower abdomen and back.   A sense of increased pelvic pressure.   A watery or bloody mucus discharge that comes from the vagina.  TREATMENT Depending on the length of the pregnancy and other circumstances, your health care provider may suggest bed rest. If necessary, there are medicines that can be given to stop contractions and to mature the fetal lungs. If labor happens before 34 weeks of pregnancy, a prolonged hospital stay may be recommended. Treatment depends on the condition of both you and the fetus.  WHAT SHOULD YOU DO IF YOU THINK YOU ARE IN PRETERM LABOR? Call your health care provider right away. You will need to go to the hospital to get checked immediately. HOW CAN YOU PREVENT PRETERM LABOR IN FUTURE PREGNANCIES? You should:   Stop smoking if you smoke.  Maintain healthy weight gain and avoid chemicals and drugs that are not necessary.  Be watchful for any type of infection.  Inform your health care provider if you have a known history of preterm labor.   This information is not intended to replace advice given to you by your health care provider. Make sure you discuss any questions you have with your health care provider.   Document Released: 08/04/2003 Document Revised: 01/14/2013 Document Reviewed: 06/16/2012 Elsevier Interactive Patient Education [employer] Third Trimester of Pregnancy The third trimester is from week 29 through week 42, months 7 through 9. The third trimester is a time when the fetus is growing rapidly. At the end of the ninth month, the fetus is about 20 inches in length and weighs 6-10 pounds.  BODY CHANGES Your body goes through many changes during pregnancy. The changes vary from woman to woman.   Your weight will continue to increase. You can expect to gain 25-35 pounds (11-16 kg) by the end of the pregnancy.  You may begin to get stretch marks on your hips, abdomen, and  breasts.  You may urinate more often because the fetus is moving lower into your pelvis and pressing on your bladder.  You may develop or continue to have heartburn as a result of your pregnancy.  You may develop constipation because certain hormones are causing the muscles that push waste through your intestines to slow down.  You may develop hemorrhoids or swollen, bulging veins (varicose veins).  You may have pelvic pain because of the weight gain and pregnancy hormones relaxing your joints between the bones in your pelvis. Backaches may result from overexertion of the muscles supporting your posture.  You may have changes in your hair. These can include thickening of your hair, rapid growth, and changes in texture. Some women also have hair loss during or after pregnancy, or hair that feels dry or thin. Your hair will most likely return to normal after your baby is born.  Your breasts will continue to grow and be tender. A yellow discharge may leak from your breasts called colostrum.  Your belly button may stick out.  You may feel short of breath because of your expanding uterus.  You may notice the fetus "dropping," or moving lower in your abdomen.  You may have a bloody mucus discharge. This usually  occurs a few days to a week before labor begins.  Your cervix becomes thin and soft (effaced) near your due date. WHAT TO EXPECT AT YOUR PRENATAL EXAMS  You will have prenatal exams every 2 weeks until week 36. Then, you will have weekly prenatal exams. During a routine prenatal visit:  You will be weighed to make sure you and the fetus are growing normally.  Your blood pressure is taken.  Your abdomen will be measured to track your baby's growth.  The fetal heartbeat will be listened to.  Any test results from the previous visit will be discussed.  You may have a cervical check near your due date to see if you have effaced. At around 36 weeks, your caregiver will check your  cervix. At the same time, your caregiver will also perform a test on the secretions of the vaginal tissue. This test is to determine if a type of bacteria, Group B streptococcus, is present. Your caregiver will explain this further. Your caregiver may ask you:  What your birth plan is.  How you are feeling.  If you are feeling the baby move.  If you have had any abnormal symptoms, such as leaking fluid, bleeding, severe headaches, or abdominal cramping.  If you are using any tobacco products, including cigarettes, chewing tobacco, and electronic cigarettes.  If you have any questions. Other tests or screenings that may be performed during your third trimester include:  Blood tests that check for low iron levels (anemia).  Fetal testing to check the health, activity level, and growth of the fetus. Testing is done if you have certain medical conditions or if there are problems during the pregnancy.  HIV (human immunodeficiency virus) testing. If you are at high risk, you may be screened for HIV during your third trimester of pregnancy. FALSE LABOR You may feel small, irregular contractions that eventually go away. These are called Braxton Hicks contractions, or false labor. Contractions may last for hours, days, or even weeks before true labor sets in. If contractions come at regular intervals, intensify, or become painful, it is best to be seen by your caregiver.  SIGNS OF LABOR   Menstrual-like cramps.  Contractions that are 5 minutes apart or less.  Contractions that start on the top of the uterus and spread down to the lower abdomen and back.  A sense of increased pelvic pressure or back pain.  A watery or bloody mucus discharge that comes from the vagina. If you have any of these signs before the 37th week of pregnancy, call your caregiver right away. You need to go to the hospital to get checked immediately. HOME CARE INSTRUCTIONS   Avoid all smoking, herbs, alcohol, and  unprescribed drugs. These chemicals affect the formation and growth of the baby.  Do not use any tobacco products, including cigarettes, chewing tobacco, and electronic cigarettes. If you need help quitting, ask your health care provider. You may receive counseling support and other resources to help you quit.  Follow your caregiver's instructions regarding medicine use. There are medicines that are either safe or unsafe to take during pregnancy.  Exercise only as directed by your caregiver. Experiencing uterine cramps is a good sign to stop exercising.  Continue to eat regular, healthy meals.  Wear a good support bra for breast tenderness.  Do not use hot tubs, steam rooms, or saunas.  Wear your seat belt at all times when driving.  Avoid raw meat, uncooked cheese, cat litter boxes, and soil used  by cats. These carry germs that can cause birth defects in the baby.  Take your prenatal vitamins.  Take 1500-2000 mg of calcium daily starting at the 20th week of pregnancy until you deliver your baby.  Try taking a stool softener (if your caregiver approves) if you develop constipation. Eat more high-fiber foods, such as fresh vegetables or fruit and whole grains. Drink plenty of fluids to keep your urine clear or pale yellow.  Take warm sitz baths to soothe any pain or discomfort caused by hemorrhoids. Use hemorrhoid cream if your caregiver approves.  If you develop varicose veins, wear support hose. Elevate your feet for 15 minutes, 3-4 times a day. Limit salt in your diet.  Avoid heavy lifting, wear low heal shoes, and practice good posture.  Rest a lot with your legs elevated if you have leg cramps or low back pain.  Visit your dentist if you have not gone during your pregnancy. Use a soft toothbrush to brush your teeth and be gentle when you floss.  A sexual relationship may be continued unless your caregiver directs you otherwise.  Do not travel far distances unless it is  absolutely necessary and only with the approval of your caregiver.  Take prenatal classes to understand, practice, and ask questions about the labor and delivery.  Make a trial run to the hospital.  Pack your hospital bag.  Prepare the baby's nursery.  Continue to go to all your prenatal visits as directed by your caregiver. SEEK MEDICAL CARE IF:  You are unsure if you are in labor or if your water has broken.  You have dizziness.  You have mild pelvic cramps, pelvic pressure, or nagging pain in your abdominal area.  You have persistent nausea, vomiting, or diarrhea.  You have a bad smelling vaginal discharge.  You have pain with urination. SEEK IMMEDIATE MEDICAL CARE IF:   You have a fever.  You are leaking fluid from your vagina.  You have spotting or bleeding from your vagina.  You have severe abdominal cramping or pain.  You have rapid weight loss or gain.  You have shortness of breath with chest pain.  You notice sudden or extreme swelling of your face, hands, ankles, feet, or legs.  You have not felt your baby move in over an hour.  You have severe headaches that do not go away with medicine.  You have vision changes.   This information is not intended to replace advice given to you by your health care provider. Make sure you discuss any questions you have with your health care provider.   Document Released: 05/08/2001 Document Revised: 06/04/2014 Document Reviewed: 07/15/2012 Elsevier Interactive Patient Education [employer]

## 2015-09-13 NOTE — Progress Notes (Signed)
Breastfeeding tip of the week reviewed. 

## 2015-09-27 ENCOUNTER — Ambulatory Visit (INDEPENDENT_AMBULATORY_CARE_PROVIDER_SITE_OTHER): Payer: 59 | Admitting: Family

## 2015-09-27 VITALS — BP 105/62 | HR 68 | Wt 196.0 lb

## 2015-09-27 DIAGNOSIS — Z3403 Encounter for supervision of normal first pregnancy, third trimester: Secondary | ICD-10-CM

## 2015-09-27 LAB — POCT URINALYSIS DIP (DEVICE)
Bilirubin Urine: NEGATIVE
Glucose, UA: NEGATIVE mg/dL
Hgb urine dipstick: NEGATIVE
Ketones, ur: NEGATIVE mg/dL
Leukocytes, UA: NEGATIVE
Nitrite: NEGATIVE
Protein, ur: NEGATIVE mg/dL
Specific Gravity, Urine: 1.015 (ref 1.005–1.030)
Urobilinogen, UA: 0.2 mg/dL (ref 0.0–1.0)
pH: 7.5 (ref 5.0–8.0)

## 2015-09-27 NOTE — Progress Notes (Signed)
Subjective:  Bianca Hodge is a 35 y.o. G2P0010 at 4321w0d being seen today for ongoing prenatal care.  She is currently monitored for the following issues for this low-risk pregnancy and has Irritable bowel syndrome (IBS); Supervision of low-risk first pregnancy; and Rubella non-immune status, antepartum on her problem list.  Patient reports sharp pain in groin when walking or getting off the sofa.  Also discussed discomfort with intercourse (dry)..  Contractions: Irritability. Vag. Bleeding: None.  Movement: Present. Denies leaking of fluid.   The following portions of the patient's history were reviewed and updated as appropriate: allergies, current medications, past family history, past medical history, past social history, past surgical history and problem list. Problem list updated.  Objective:   Filed Vitals:   09/27/15 1045  BP: 105/62  Pulse: 68  Weight: 196 lb (88.905 kg)    Fetal Status: Fetal Heart Rate (bpm): 142 Fundal Height: 33 cm Movement: Present     General:  Alert, oriented and cooperative. Patient is in no acute distress.  Skin: Skin is warm and dry. No rash noted.   Cardiovascular: Normal heart rate noted  Respiratory: Normal respiratory effort, no problems with respiration noted  Abdomen: Soft, gravid, appropriate for gestational age. Pain/Pressure: Present     Pelvic: Vag. Bleeding: None     Cervical exam deferred        Extremities: Normal range of motion.  Edema: None  Mental Status: Normal mood and affect. Normal behavior. Normal judgment and thought content.   Urinalysis: Urine Protein: Negative Urine Glucose: Negative  Assessment and Plan:  Pregnancy: G2P0010 at 3921w0d  1. Encounter for supervision of normal first pregnancy in third trimester - Reviewed round ligament pain - Discussed GBS and GC/CT at next visit - Recommended use of lubricants and altering positions for intercourse  Preterm labor symptoms and general obstetric precautions including  but not limited to vaginal bleeding, contractions, leaking of fluid and fetal movement were reviewed in detail with the patient. Please refer to After Visit Summary for other counseling recommendations.  Return in about 2 weeks (around 10/11/2015).   Eino FarberWalidah Kennith GainN Karim, CNM

## 2015-09-27 NOTE — Progress Notes (Signed)
Patient reports occasional pelvic pain 

## 2015-10-11 ENCOUNTER — Ambulatory Visit (INDEPENDENT_AMBULATORY_CARE_PROVIDER_SITE_OTHER): Payer: 59 | Admitting: Student

## 2015-10-11 VITALS — BP 105/61 | HR 71 | Temp 98.6°F | Wt 199.4 lb

## 2015-10-11 DIAGNOSIS — Z3403 Encounter for supervision of normal first pregnancy, third trimester: Secondary | ICD-10-CM | POA: Diagnosis not present

## 2015-10-11 DIAGNOSIS — Z113 Encounter for screening for infections with a predominantly sexual mode of transmission: Secondary | ICD-10-CM

## 2015-10-11 LAB — POCT URINALYSIS DIP (DEVICE)
Bilirubin Urine: NEGATIVE
Glucose, UA: NEGATIVE mg/dL
Hgb urine dipstick: NEGATIVE
Ketones, ur: NEGATIVE mg/dL
Leukocytes, UA: NEGATIVE
Nitrite: NEGATIVE
Protein, ur: NEGATIVE mg/dL
Specific Gravity, Urine: 1.015 (ref 1.005–1.030)
Urobilinogen, UA: 1 mg/dL (ref 0.0–1.0)
pH: 7 (ref 5.0–8.0)

## 2015-10-11 LAB — OB RESULTS CONSOLE GBS: GBS: NEGATIVE

## 2015-10-11 NOTE — Patient Instructions (Signed)

## 2015-10-12 LAB — GC/CHLAMYDIA PROBE AMP (~~LOC~~) NOT AT ARMC
Chlamydia: NEGATIVE
Neisseria Gonorrhea: NEGATIVE

## 2015-10-12 NOTE — Progress Notes (Signed)
Subjective:  Bianca Hodge is a 35 y.o. G2P0010 at [redacted]w[redacted]d being seen today for ongoing prenatal care.  She is currently monitored for the following issues for this low-risk pregnancy and has Irritable bowel syndrome (IBS); Supervision of low-risk first pregnancy; and Rubella non-immune status, antepartum on her problem list.  Patient reports no complaints.  Contractions: Not present. Vag. Bleeding: None.  Movement: Present. Denies leaking of fluid.   The following portions of the patient's history were reviewed and updated as appropriate: allergies, current medications, past family history, past medical history, past social history, past surgical history and problem list. Problem list updated.  Objective:   Filed Vitals:   10/11/15 1544  BP: 105/61  Pulse: 71  Temp: 98.6 F (37 C)  Weight: 199 lb 6.4 oz (90.447 kg)    Fetal Status: Fetal Heart Rate (bpm): 138 Fundal Height: 35 cm Movement: Present  Presentation: Vertex  General:  Alert, oriented and cooperative. Patient is in no acute distress.  Skin: Skin is warm and dry. No rash noted.   Cardiovascular: Normal heart rate noted  Respiratory: Normal respiratory effort, no problems with respiration noted  Abdomen: Soft, gravid, appropriate for gestational age. Pain/Pressure: Present     Pelvic: Vag. Bleeding: None     Cervical exam performed Dilation: Closed Effacement (%): Thick Station: -3  Extremities: Normal range of motion.  Edema: Mild pitting, slight indentation  Mental Status: Normal mood and affect. Normal behavior. Normal judgment and thought content.   Urinalysis: Urine Protein: Negative Urine Glucose: Negative  Assessment and Plan:  Pregnancy: G2P0010 at [redacted]w[redacted]d  1. Encounter for supervision of normal first pregnancy in third trimester -birth plan scanned - Culture, beta strep (group b only) - GC[facility]Chlamydia probe amp ([facility]  Preterm labor symptoms and general obstetric precautions including but not  limited to vaginal bleeding, contractions, leaking of fluid and fetal movement were reviewed in detail with the patient. Please refer to After Visit Summary for other counseling recommendations.  Return in about 2 weeks (around 10/25/2015) for Routine OB.   Judeth HornErin Colonel Krauser, NP

## 2015-10-13 LAB — CULTURE, BETA STREP (GROUP B ONLY)

## 2015-10-17 ENCOUNTER — Encounter: Payer: Self-pay | Admitting: *Deleted

## 2015-10-17 DIAGNOSIS — Z3403 Encounter for supervision of normal first pregnancy, third trimester: Secondary | ICD-10-CM

## 2015-10-23 ENCOUNTER — Encounter (HOSPITAL_COMMUNITY): Payer: Self-pay | Admitting: *Deleted

## 2015-10-23 ENCOUNTER — Inpatient Hospital Stay (HOSPITAL_COMMUNITY)
Admission: AD | Admit: 2015-10-23 | Discharge: 2015-10-23 | Disposition: A | Discharge status: Home / Self Care | Payer: 59 | Source: Ambulatory Visit | Attending: Family Medicine | Admitting: Family Medicine

## 2015-10-23 DIAGNOSIS — Z3A36 36 weeks gestation of pregnancy: Secondary | ICD-10-CM | POA: Insufficient documentation

## 2015-10-23 DIAGNOSIS — O09899 Supervision of other high risk pregnancies, unspecified trimester: Secondary | ICD-10-CM

## 2015-10-23 DIAGNOSIS — O1203 Gestational edema, third trimester: Secondary | ICD-10-CM

## 2015-10-23 DIAGNOSIS — R0602 Shortness of breath: Secondary | ICD-10-CM

## 2015-10-23 DIAGNOSIS — Z886 Allergy status to analgesic agent status: Secondary | ICD-10-CM | POA: Diagnosis not present

## 2015-10-23 DIAGNOSIS — O9989 Other specified diseases and conditions complicating pregnancy, childbirth and the puerperium: Secondary | ICD-10-CM

## 2015-10-23 DIAGNOSIS — O99891 Other specified diseases and conditions complicating pregnancy: Secondary | ICD-10-CM

## 2015-10-23 DIAGNOSIS — Z2839 Encounter for supervision of normal pregnancy, unspecified, unspecified trimester: Secondary | ICD-10-CM

## 2015-10-23 DIAGNOSIS — Z3403 Encounter for supervision of normal first pregnancy, third trimester: Secondary | ICD-10-CM | POA: Diagnosis not present

## 2015-10-23 DIAGNOSIS — Z283 Underimmunization status: Secondary | ICD-10-CM

## 2015-10-23 LAB — URINALYSIS, ROUTINE W REFLEX MICROSCOPIC
Bilirubin Urine: NEGATIVE
Glucose, UA: NEGATIVE mg/dL
Hgb urine dipstick: NEGATIVE
Ketones, ur: NEGATIVE mg/dL
Leukocytes, UA: NEGATIVE
Nitrite: NEGATIVE
Protein, ur: NEGATIVE mg/dL
Specific Gravity, Urine: 1.025 (ref 1.005–1.030)
pH: 5.5 (ref 5.0–8.0)

## 2015-10-23 LAB — AMNISURE RUPTURE OF MEMBRANE (ROM) NOT AT ARMC: Amnisure ROM: NEGATIVE

## 2015-10-23 NOTE — MAU Provider Note (Signed)
History   G2P0010 @ 36.5 wks in with several vague complaints edema, SOB, numbness in hands that comes and goes. Started yesterday after she ate pizza that edema got worse, also c/o clear vag discharge  For over a week and is concerned she might be leaking her water.  CSN: [id number]  Arrival date & time 10/23/15  1355   None     Chief Complaint  Patient presents with  . Shortness of Breath  . Leg Swelling    HPI  Past Medical History  Diagnosis Date  . Parasite infection 06-04-2014    Past Surgical History  Procedure Laterality Date  . Appendectomy      History reviewed. No pertinent family history.  Social History  Substance Use Topics  . Smoking status: Never Smoker   . Smokeless tobacco: None  . Alcohol Use: Yes    OB History    Gravida Para Term Preterm AB TAB SAB Ectopic Multiple Living   2    1 1           Review of Systems  Constitutional: Negative.   HENT: Negative.   Respiratory: Positive for shortness of breath.   Cardiovascular: Negative.   Gastrointestinal: Negative.   Endocrine: Negative.   Genitourinary: Negative.   Musculoskeletal: Negative.   Allergic/Immunologic: Negative.   Neurological: Positive for numbness.  Hematological: Negative.   Psychiatric/Behavioral: Negative.     Allergies  Gluten meal and Ibuprofen  Home Medications  No current outpatient prescriptions on file.  BP 120/80 mmHg  Pulse 75  Temp(Src) 98 F (36.7 C) (Oral)  Resp 18  Ht 5\' 7"  (1.702 m)  Wt 202 lb 1.3 oz (91.663 kg)  BMI 31.64 kg/m2  SpO2 100%  LMP 02/11/2015 (Within Days)  Physical Exam  Constitutional: She is oriented to person, place, and time. She appears well-developed and well-nourished.  HENT:  Head: Normocephalic.  Eyes: Pupils are equal, round, and reactive to light.  Neck: Normal range of motion.  Cardiovascular: Normal rate, regular rhythm, normal heart sounds and intact distal pulses.   Pulmonary/Chest: Effort normal and breath sounds  normal.  Abdominal: Soft. Bowel sounds are normal.  Genitourinary: Vagina normal and uterus normal.  Musculoskeletal: Normal range of motion.  Neurological: She is alert and oriented to person, place, and time. She has normal reflexes.  Skin: Skin is warm and dry.  Psychiatric: She has a normal mood and affect. Her behavior is normal. Judgment and thought content normal.    MAU Course  Procedures (including critical care time)  Labs Reviewed  URINALYSIS, ROUTINE W REFLEX MICROSCOPIC (NOT AT [facility]) - Abnormal; Notable for the following:    APPearance HAZY (*)    All other components within normal limits  AMNISURE RUPTURE OF MEMBRANE (ROM) NOT AT [facility]   No results found.   1. Rubella non-immune status, antepartum   2. Encounter for supervision of normal first pregnancy in third trimester       MDM  FHR pattern reassurring, amnisure, discussed decreasing salt intake to help with edema. D/c home

## 2015-10-23 NOTE — MAU Note (Addendum)
Patient presents with SOB and feet swelling, had contractions last night for a few hours, has had a weight gain, left side more swollen than right. Patient concerned might have a slow leak of amniotic fluid has noticed increase in her vaginal discharge for a couple of months.

## 2015-10-23 NOTE — Discharge Instructions (Signed)
Braxton Hicks Contractions °Contractions of the uterus can occur throughout pregnancy. Contractions are not always a sign that you are in labor.  °WHAT ARE BRAXTON HICKS CONTRACTIONS?  °Contractions that occur before labor are called Braxton Hicks contractions, or false labor. Toward the end of pregnancy (32-34 weeks), these contractions can develop more often and may become more forceful. This is not true labor because these contractions do not result in opening (dilatation) and thinning of the cervix. They are sometimes difficult to tell apart from true labor because these contractions can be forceful and people have different pain tolerances. You should not feel embarrassed if you go to the hospital with false labor. Sometimes, the only way to tell if you are in true labor is for your health care provider to look for changes in the cervix. °If there are no prenatal problems or other health problems associated with the pregnancy, it is completely safe to be sent home with false labor and await the onset of true labor. °HOW CAN YOU TELL THE DIFFERENCE BETWEEN TRUE AND FALSE LABOR? °False Labor °· The contractions of false labor are usually shorter and not as hard as those of true labor.   °· The contractions are usually irregular.   °· The contractions are often felt in the front of the lower abdomen and in the groin.   °· The contractions may go away when you walk around or change positions while lying down.   °· The contractions get weaker and are shorter lasting as time goes on.   °· The contractions do not usually become progressively stronger, regular, and closer together as with true labor.   °True Labor °· Contractions in true labor last 30-70 seconds, become very regular, usually become more intense, and increase in frequency.   °· The contractions do not go away with walking.   °· The discomfort is usually felt in the top of the uterus and spreads to the lower abdomen and low back.   °· True labor can be  determined by your health care provider with an exam. This will show that the cervix is dilating and getting thinner.   °WHAT TO REMEMBER °· Keep up with your usual exercises and follow other instructions given by your health care provider.   °· Take medicines as directed by your health care provider.   °· Keep your regular prenatal appointments.   °· Eat and drink lightly if you think you are going into labor.   °· If Braxton Hicks contractions are making you uncomfortable:   °¨ Change your position from lying down or resting to walking, or from walking to resting.   °¨ Sit and rest in a tub of warm water.   °¨ Drink 2-3 glasses of water. Dehydration may cause these contractions.   °¨ Do slow and deep breathing several times an hour.   °WHEN SHOULD I SEEK IMMEDIATE MEDICAL CARE? °Seek immediate medical care if: °· Your contractions become stronger, more regular, and closer together.   °· You have fluid leaking or gushing from your vagina.   °· You have a fever.   °· You have vaginal bleeding.   °· You have continuous abdominal pain.   °· You have low back pain that you never had before.   °· You feel your baby's head pushing down and causing pelvic pressure.   °· Your baby is not moving as much as it used to.   °  °This information is not intended to replace advice given to you by your health care provider. Make sure you discuss any questions you have with your health care provider. °  °Document Released: 05/14/2005   Document Revised: 05/19/2013 Document Reviewed: 02/23/2013 °Elsevier Interactive Patient Education ©2016 [employer] Inc[employer] ° °

## 2015-10-25 ENCOUNTER — Ambulatory Visit (INDEPENDENT_AMBULATORY_CARE_PROVIDER_SITE_OTHER): Payer: 59 | Admitting: Family Medicine

## 2015-10-25 VITALS — BP 118/80 | HR 73 | Wt 200.0 lb

## 2015-10-25 DIAGNOSIS — Z283 Underimmunization status: Secondary | ICD-10-CM

## 2015-10-25 DIAGNOSIS — Z3403 Encounter for supervision of normal first pregnancy, third trimester: Secondary | ICD-10-CM

## 2015-10-25 DIAGNOSIS — Z2839 Other underimmunization status: Secondary | ICD-10-CM

## 2015-10-25 DIAGNOSIS — O9989 Other specified diseases and conditions complicating pregnancy, childbirth and the puerperium: Secondary | ICD-10-CM

## 2015-10-25 DIAGNOSIS — O09899 Supervision of other high risk pregnancies, unspecified trimester: Secondary | ICD-10-CM

## 2015-10-25 DIAGNOSIS — O99891 Other specified diseases and conditions complicating pregnancy: Secondary | ICD-10-CM

## 2015-10-25 LAB — POCT URINALYSIS DIP (DEVICE)
Bilirubin Urine: NEGATIVE
Glucose, UA: NEGATIVE mg/dL
Hgb urine dipstick: NEGATIVE
Ketones, ur: NEGATIVE mg/dL
Leukocytes, UA: NEGATIVE
Nitrite: NEGATIVE
Protein, ur: NEGATIVE mg/dL
Specific Gravity, Urine: 1.025 (ref 1.005–1.030)
Urobilinogen, UA: 1 mg/dL (ref 0.0–1.0)
pH: 6 (ref 5.0–8.0)

## 2015-10-25 NOTE — Progress Notes (Deleted)
Subjective:  Bianca Hodge is a 35 y.o. G2P0010 at 39w0dbeing seen today for ongoing prenatal care.  She is currently monitored for the following issues for this low-risk pregnancy and has Irritable bowel syndrome (IBS); Supervision of low-risk first pregnancy; and Rubella non-immune status, antepartum on her problem list.  Patient reports {sx:14538}.   .  .   . Denies leaking of fluid.   The following portions of the patient's history were reviewed and updated as appropriate: allergies, current medications, past family history, past medical history, past social history, past surgical history and problem list. Problem list updated.  Objective:  There were no vitals filed for this visit.  Fetal Status:           General:  Alert, oriented and cooperative. Patient is in no acute distress.  Skin: Skin is warm and dry. No rash noted.   Cardiovascular: Normal heart rate noted  Respiratory: Normal respiratory effort, no problems with respiration noted  Abdomen: Soft, gravid, appropriate for gestational age.       Pelvic:       {Blank single:19197::"Cervical exam performed","Cervical exam deferred"}        Extremities: Normal range of motion.     Mental Status: Normal mood and affect. Normal behavior. Normal judgment and thought content.   Urinalysis:      Assessment and Plan:  Pregnancy: G2P0010 at 362w0d1. Encounter for supervision of normal first pregnancy in third trimester - updated box - confirmed vertex by [place] head low in pelvis.   2. Rubella non-immune status, antepartum Need MMR pp  {Blank single:19197::"Term","Preterm"} labor symptoms and general obstetric precautions including but not limited to vaginal bleeding, contractions, leaking of fluid and fetal movement were reviewed in detail with the patient. Please refer to After Visit Summary for other counseling recommendations.  Return in about 1 week (around 11/01/2015) for Routine prenatal care.   KiCaren MacadamMD

## 2015-10-25 NOTE — Patient Instructions (Signed)

## 2015-11-01 ENCOUNTER — Ambulatory Visit (INDEPENDENT_AMBULATORY_CARE_PROVIDER_SITE_OTHER): Payer: 59 | Admitting: Student

## 2015-11-01 VITALS — BP 122/73 | HR 79 | Wt 203.4 lb

## 2015-11-01 DIAGNOSIS — Z3403 Encounter for supervision of normal first pregnancy, third trimester: Secondary | ICD-10-CM

## 2015-11-01 LAB — POCT URINALYSIS DIP (DEVICE)
Bilirubin Urine: NEGATIVE
Glucose, UA: NEGATIVE mg/dL
Hgb urine dipstick: NEGATIVE
Ketones, ur: NEGATIVE mg/dL
Nitrite: NEGATIVE
Protein, ur: NEGATIVE mg/dL
Specific Gravity, Urine: 1.015 (ref 1.005–1.030)
Urobilinogen, UA: 1 mg/dL (ref 0.0–1.0)
pH: 7 (ref 5.0–8.0)

## 2015-11-01 NOTE — Patient Instructions (Signed)
Vaginal Delivery During delivery, your health care provider will help you give birth to your baby. During a vaginal delivery, you will work to push the baby out of your vagina. However, before you can push your baby out, a few things need to happen. The opening of your uterus (cervix) has to soften, thin out, and open up (dilate) all the way to 10 cm. Also, your baby has to move down from the uterus into your vagina.  SIGNS OF LABOR  Your health care provider will first need to make sure you are in labor. Signs of labor include:   Passing what is called the mucous plug before labor begins. This is a small amount of blood-stained mucus.  Having regular, painful uterine contractions.   The time between contractions gets shorter.   The discomfort and pain gradually get more intense.  Contraction pains get worse when walking and do not go away when resting.   Your cervix becomes thinner (effacement) and dilates. BEFORE THE DELIVERY Once you are in labor and admitted into the hospital or care center, your health care provider may do the following:   Perform a complete physical exam.  Review any complications related to pregnancy or labor.  Check your blood pressure, pulse, temperature, and heart rate (vital signs).   Determine if, and when, the rupture of amniotic membranes occurred.  Do a vaginal exam (using a sterile glove and lubricant) to determine:   The position (presentation) of the baby. Is the baby's head presenting first (vertex) in the birth canal (vagina), or are the feet or buttocks first (breech)?   The level (station) of the baby's head within the birth canal.   The effacement and dilatation of the cervix.   An electronic fetal monitor is usually placed on your abdomen when you first arrive. This is used to monitor your contractions and the baby's heart rate.  When the monitor is on your abdomen (external fetal monitor), it can only pick up the frequency and  length of your contractions. It cannot tell the strength of your contractions.  If it becomes necessary for your health care provider to know exactly how strong your contractions are or to see exactly what the baby's heart rate is doing, an internal monitor may be inserted into your vagina and uterus. Your health care provider will discuss the benefits and risks of using an internal monitor and obtain your permission before inserting the device.  Continuous fetal monitoring may be needed if you have an epidural, are receiving certain medicines (such as oxytocin), or have pregnancy or labor complications.  An IV access tube may be placed into a vein in your arm to deliver fluids and medicines if necessary. THREE STAGES OF LABOR AND DELIVERY Normal labor and delivery is divided into three stages. First Stage This stage starts when you begin to contract regularly and your cervix begins to efface and dilate. It ends when your cervix is completely open (fully dilated). The first stage is the longest stage of labor and can last from 3 hours to 15 hours.  Several methods are available to help with labor pain. You and your health care provider will decide which option is best for you. Options include:   Opioid medicines. These are strong pain medicines that you can get through your IV tube or as a shot into your muscle. These medicines lessen pain but do not make it go away completely.  Epidural. A medicine is given through a thin tube that   is inserted in your back. The medicine numbs the lower part of your body and prevents any pain in that area.  Paracervical pain medicine. This is an injection of an anesthetic on each side of your cervix.   You may request natural childbirth, which does not involve the use of pain medicines or an epidural during labor and delivery. Instead, you will use other things, such as breathing exercises, to help cope with the pain. Second Stage The second stage of labor  begins when your cervix is fully dilated at 10 cm. It continues until you push your baby down through the birth canal and the baby is born. This stage can take only minutes or several hours.  The location of your baby's head as it moves through the birth canal is reported as a number called a station. If the baby's head has not started its descent, the station is described as being at minus 3 (-3). When your baby's head is at the zero station, it is at the middle of the birth canal and is engaged in the pelvis. The station of your baby helps indicate the progress of the second stage of labor.  When your baby is born, your health care provider may hold the baby with his or her head lowered to prevent amniotic fluid, mucus, and blood from getting into the baby's lungs. The baby's mouth and nose may be suctioned with a small bulb syringe to remove any additional fluid.  Your health care provider may then place the baby on your stomach. It is important to keep the baby from getting cold. To do this, the health care provider will dry the baby off, place the baby directly on your skin (with no blankets between you and the baby), and cover the baby with warm, dry blankets.   The umbilical cord is cut. Third Stage During the third stage of labor, your health care provider will deliver the placenta (afterbirth) and make sure your bleeding is under control. The delivery of the placenta usually takes about 5 minutes but can take up to 30 minutes. After the placenta is delivered, a medicine may be given either by IV or injection to help contract the uterus and control bleeding. If you are planning to breastfeed, you can try to do so now. After you deliver the placenta, your uterus should contract and get very firm. If your uterus does not remain firm, your health care provider will massage it. This is important because the contraction of the uterus helps cut off bleeding at the site where the placenta was attached  to your uterus. If your uterus does not contract properly and stay firm, you may continue to bleed heavily. If there is a lot of bleeding, medicines may be given to contract the uterus and stop the bleeding.    This information is not intended to replace advice given to you by your health care provider. Make sure you discuss any questions you have with your health care provider.   Document Released: 02/21/2008 Document Revised: 06/04/2014 Document Reviewed: 01/09/2012 Elsevier Interactive Patient Education 2016 Elsevier Inc. Ball CorporationBraxton Hicks Contractions Contractions of the uterus can occur throughout pregnancy. Contractions are not always a sign that you are in labor.  WHAT ARE BRAXTON HICKS CONTRACTIONS?  Contractions that occur before labor are called Braxton Hicks contractions, or false labor. Toward the end of pregnancy (32-34 weeks), these contractions can develop more often and may become more forceful. This is not true labor because these contractions  do not result in opening (dilatation) and thinning of the cervix. They are sometimes difficult to tell apart from true labor because these contractions can be forceful and people have different pain tolerances. You should not feel embarrassed if you go to the hospital with false labor. Sometimes, the only way to tell if you are in true labor is for your health care provider to look for changes in the cervix. If there are no prenatal problems or other health problems associated with the pregnancy, it is completely safe to be sent home with false labor and await the onset of true labor. HOW CAN YOU TELL THE DIFFERENCE BETWEEN TRUE AND FALSE LABOR? False Labor  The contractions of false labor are usually shorter and not as hard as those of true labor.   The contractions are usually irregular.   The contractions are often felt in the front of the lower abdomen and in the groin.   The contractions may go away when you walk around or change  positions while lying down.   The contractions get weaker and are shorter lasting as time goes on.   The contractions do not usually become progressively stronger, regular, and closer together as with true labor.  True Labor  Contractions in true labor last 30-70 seconds, become very regular, usually become more intense, and increase in frequency.   The contractions do not go away with walking.   The discomfort is usually felt in the top of the uterus and spreads to the lower abdomen and low back.   True labor can be determined by your health care provider with an exam. This will show that the cervix is dilating and getting thinner.  WHAT TO REMEMBER  Keep up with your usual exercises and follow other instructions given by your health care provider.   Take medicines as directed by your health care provider.   Keep your regular prenatal appointments.   Eat and drink lightly if you think you are going into labor.   If Braxton Hicks contractions are making you uncomfortable:   Change your position from lying down or resting to walking, or from walking to resting.   Sit and rest in a tub of warm water.   Drink 2-3 glasses of water. Dehydration may cause these contractions.   Do slow and deep breathing several times an hour.  WHEN SHOULD I SEEK IMMEDIATE MEDICAL CARE? Seek immediate medical care if:  Your contractions become stronger, more regular, and closer together.   You have fluid leaking or gushing from your vagina.   You have a fever.   You pass blood-tinged mucus.   You have vaginal bleeding.   You have continuous abdominal pain.   You have low back pain that you never had before.   You feel your baby's head pushing down and causing pelvic pressure.   Your baby is not moving as much as it used to.    This information is not intended to replace advice given to you by your health care provider. Make sure you discuss any questions you  have with your health care provider.   Document Released: 05/14/2005 Document Revised: 05/19/2013 Document Reviewed: 02/23/2013 Elsevier Interactive Patient Education [employer]

## 2015-11-02 NOTE — Progress Notes (Signed)
Subjective:  Bianca Hodge is a 35 y.o. G2P0010 at 6733w1d being seen today for ongoing prenatal care.  She is currently monitored for the following issues for this low-risk pregnancy and has Irritable bowel syndrome (IBS); Supervision of low-risk first pregnancy; and Rubella non-immune status, antepartum on her problem list.  Patient reports occasional contractions.  Contractions: Irregular. Vag. Bleeding: None.  Movement: Present. Denies leaking of fluid.   The following portions of the patient's history were reviewed and updated as appropriate: allergies, current medications, past family history, past medical history, past social history, past surgical history and problem list. Problem list updated.  Objective:   Filed Vitals:   11/01/15 1429  BP: 122/73  Pulse: 79  Weight: 203 lb 6.4 oz (92.262 kg)    Fetal Status: Fetal Heart Rate (bpm): 156 Fundal Height: 38 cm Movement: Present     General:  Alert, oriented and cooperative. Patient is in no acute distress.  Skin: Skin is warm and dry. No rash noted.   Cardiovascular: Normal heart rate noted  Respiratory: Normal respiratory effort, no problems with respiration noted  Abdomen: Soft, gravid, appropriate for gestational age. Pain/Pressure: Present     Pelvic: Vag. Bleeding: None Vag D/C Character: White   Cervical exam deferred        Extremities: Normal range of motion.  Edema: Trace  Mental Status: Normal mood and affect. Normal behavior. Normal judgment and thought content.   Urinalysis: Urine Protein: Negative Urine Glucose: Negative  Assessment and Plan:  Pregnancy: G2P0010 at 1933w1d  1. Encounter for supervision of normal first pregnancy in third trimester   Term labor symptoms and general obstetric precautions including but not limited to vaginal bleeding, contractions, leaking of fluid and fetal movement were reviewed in detail with the patient. Please refer to After Visit Summary for other counseling recommendations.   Return in about 1 week (around 11/08/2015) for Routine OB.   Judeth HornErin Fahmida Jurich, NP

## 2015-11-08 ENCOUNTER — Inpatient Hospital Stay (HOSPITAL_COMMUNITY)
Admission: AD | Admit: 2015-11-08 | Discharge: 2015-11-08 | Disposition: A | Discharge status: Home / Self Care | Payer: 59 | Source: Ambulatory Visit | Attending: Obstetrics & Gynecology | Admitting: Obstetrics & Gynecology

## 2015-11-08 ENCOUNTER — Encounter (HOSPITAL_COMMUNITY): Payer: Self-pay | Admitting: Certified Nurse Midwife

## 2015-11-08 ENCOUNTER — Ambulatory Visit (INDEPENDENT_AMBULATORY_CARE_PROVIDER_SITE_OTHER): Payer: 59 | Admitting: Medical

## 2015-11-08 VITALS — BP 137/85 | HR 79 | Wt 205.5 lb

## 2015-11-08 DIAGNOSIS — Z3403 Encounter for supervision of normal first pregnancy, third trimester: Secondary | ICD-10-CM

## 2015-11-08 DIAGNOSIS — Z3493 Encounter for supervision of normal pregnancy, unspecified, third trimester: Secondary | ICD-10-CM

## 2015-11-08 DIAGNOSIS — O163 Unspecified maternal hypertension, third trimester: Secondary | ICD-10-CM

## 2015-11-08 DIAGNOSIS — O133 Gestational [pregnancy-induced] hypertension without significant proteinuria, third trimester: Secondary | ICD-10-CM

## 2015-11-08 LAB — POCT URINALYSIS DIP (DEVICE)
Bilirubin Urine: NEGATIVE
Glucose, UA: NEGATIVE mg/dL
Hgb urine dipstick: NEGATIVE
Ketones, ur: NEGATIVE mg/dL
Nitrite: NEGATIVE
Protein, ur: NEGATIVE mg/dL
Specific Gravity, Urine: 1.02 (ref 1.005–1.030)
Urobilinogen, UA: 0.2 mg/dL (ref 0.0–1.0)
pH: 5.5 (ref 5.0–8.0)

## 2015-11-08 LAB — COMPREHENSIVE METABOLIC PANEL
ALT: 20 U/L (ref 6–29)
AST: 19 U/L (ref 10–30)
Albumin: 3.2 g/dL — ABNORMAL LOW (ref 3.6–5.1)
Alkaline Phosphatase: 163 U/L — ABNORMAL HIGH (ref 33–115)
BUN: 9 mg/dL (ref 7–25)
CO2: 21 mmol/L (ref 20–31)
Calcium: 9 mg/dL (ref 8.6–10.2)
Chloride: 105 mmol/L (ref 98–110)
Creat: 0.55 mg/dL (ref 0.50–1.10)
Glucose, Bld: 72 mg/dL (ref 65–99)
Potassium: 4 mmol/L (ref 3.5–5.3)
Sodium: 134 mmol/L — ABNORMAL LOW (ref 135–146)
Total Bilirubin: 0.3 mg/dL (ref 0.2–1.2)
Total Protein: 5.7 g/dL — ABNORMAL LOW (ref 6.1–8.1)

## 2015-11-08 LAB — CBC
HCT: 35.6 % (ref 35.0–45.0)
Hemoglobin: 11.8 g/dL (ref 11.7–15.5)
MCH: 29.1 pg (ref 27.0–33.0)
MCHC: 33.1 g/dL (ref 32.0–36.0)
MCV: 87.7 fL (ref 80.0–100.0)
MPV: 11.7 fL (ref 7.5–12.5)
Platelets: 251 10*3/uL (ref 140–400)
RBC: 4.06 MIL/uL (ref 3.80–5.10)
RDW: 14.4 % (ref 11.0–15.0)
WBC: 12.1 10*3/uL — ABNORMAL HIGH (ref 3.8–10.8)

## 2015-11-08 NOTE — Progress Notes (Signed)
No headache ? Blurred vision + floaters - chronic Swelling - improves with rest D/w pratt - labs today   Subjective:  Bianca Hodge is a 35 y.o. G2P0010 at 30w0dbeing seen today for ongoing prenatal care.  She is currently monitored for the following issues for this low-risk pregnancy and has Irritable bowel syndrome (IBS); Supervision of low-risk first pregnancy; and Rubella non-immune status, antepartum on her problem list.  Patient reports contractions since this morning.  Contractions: Irregular. Vag. Bleeding: None.  Movement: Present. Denies leaking of fluid.   The following portions of the patient's history were reviewed and updated as appropriate: allergies, current medications, past family history, past medical history, past social history, past surgical history and problem list. Problem list updated.  Objective:   Filed Vitals:   11/08/15 0902 11/08/15 0905  BP: 143/81 137/85  Pulse: 79 79  Weight: 205 lb 8 oz (93.214 kg)     Fetal Status: Fetal Heart Rate (bpm): 137 Fundal Height: 39 cm Movement: Present  Presentation: Vertex  General:  Alert, oriented and cooperative. Patient is in no acute distress.  Skin: Skin is warm and dry. No rash noted.   Cardiovascular: Normal heart rate noted  Respiratory: Normal respiratory effort, no problems with respiration noted  Abdomen: Soft, gravid, appropriate for gestational age. Pain/Pressure: Present     Pelvic: Cervical exam performed Dilation: 1.5 Effacement (%): 40 Station: -2  Extremities: Normal range of motion.  Edema: Moderate pitting, indentation subsides rapidly  Mental Status: Normal mood and affect. Normal behavior. Normal judgment and thought content.   Urinalysis: Urine Protein: Trace Urine Glucose: Negative  Assessment and Plan:  Pregnancy: G2P0010 at 39w0d1. Encounter for supervision of normal first pregnancy in third trimester - Contractions have become more regular during visit today, discussed contraction  counting and reasons to come to [facility] for labor evaluation 2. Elevated blood pressure affecting pregnancy in third trimester, antepartum - Slightly elevated BP today with + LE edema and trace proteinuria  - Comp Met (CMET) - Protein / Creatinine Ratio, Urine  Term labor symptoms and general obstetric precautions including but not limited to vaginal bleeding, contractions, leaking of fluid and fetal movement were reviewed in detail with the patient. Please refer to After Visit Summary for other counseling recommendations.  Return in about 1 week (around 11/15/2015) for LOB.   JuLuvenia ReddenPA-C

## 2015-11-08 NOTE — Patient Instructions (Signed)
Fetal Movement Counts Patient Name: __________________________________________________ Patient Due Date: ____________________ Performing a fetal movement count is highly recommended in high-risk pregnancies, but it is good for every pregnant woman to do. Your health care provider may ask you to start counting fetal movements at 28 weeks of the pregnancy. Fetal movements often increase:  After eating a full meal.  After physical activity.  After eating or drinking something sweet or cold.  At rest. Pay attention to when you feel the baby is most active. This will help you notice a pattern of your baby's sleep and wake cycles and what factors contribute to an increase in fetal movement. It is important to perform a fetal movement count at the same time each day when your baby is normally most active.  HOW TO COUNT FETAL MOVEMENTS 1. Find a quiet and comfortable area to sit or lie down on your left side. Lying on your left side provides the best blood and oxygen circulation to your baby. 2. Write down the day and time on a sheet of paper or in a journal. 3. Start counting kicks, flutters, swishes, rolls, or jabs in a 2-hour period. You should feel at least 10 movements within 2 hours. 4. If you do not feel 10 movements in 2 hours, wait 2-3 hours and count again. Look for a change in the pattern or not enough counts in 2 hours. SEEK MEDICAL CARE IF:  You feel less than 10 counts in 2 hours, tried twice.  There is no movement in over an hour.  The pattern is changing or taking longer each day to reach 10 counts in 2 hours.  You feel the baby is not moving as he or she usually does. Date: ____________ Movements: ____________ Start time: ____________ Finish time: ____________  Date: ____________ Movements: ____________ Start time: ____________ Finish time: ____________ Date: ____________ Movements: ____________ Start time: ____________ Finish time: ____________ Date: ____________ Movements:  ____________ Start time: ____________ Finish time: ____________ Date: ____________ Movements: ____________ Start time: ____________ Finish time: ____________ Date: ____________ Movements: ____________ Start time: ____________ Finish time: ____________ Date: ____________ Movements: ____________ Start time: ____________ Finish time: ____________ Date: ____________ Movements: ____________ Start time: ____________ Finish time: ____________  Date: ____________ Movements: ____________ Start time: ____________ Finish time: ____________ Date: ____________ Movements: ____________ Start time: ____________ Finish time: ____________ Date: ____________ Movements: ____________ Start time: ____________ Finish time: ____________ Date: ____________ Movements: ____________ Start time: ____________ Finish time: ____________ Date: ____________ Movements: ____________ Start time: ____________ Finish time: ____________ Date: ____________ Movements: ____________ Start time: ____________ Finish time: ____________ Date: ____________ Movements: ____________ Start time: ____________ Finish time: ____________  Date: ____________ Movements: ____________ Start time: ____________ Finish time: ____________ Date: ____________ Movements: ____________ Start time: ____________ Finish time: ____________ Date: ____________ Movements: ____________ Start time: ____________ Finish time: ____________ Date: ____________ Movements: ____________ Start time: ____________ Finish time: ____________ Date: ____________ Movements: ____________ Start time: ____________ Finish time: ____________ Date: ____________ Movements: ____________ Start time: ____________ Finish time: ____________ Date: ____________ Movements: ____________ Start time: ____________ Finish time: ____________  Date: ____________ Movements: ____________ Start time: ____________ Finish time: ____________ Date: ____________ Movements: ____________ Start time: ____________ Finish  time: ____________ Date: ____________ Movements: ____________ Start time: ____________ Finish time: ____________ Date: ____________ Movements: ____________ Start time: ____________ Finish time: ____________ Date: ____________ Movements: ____________ Start time: ____________ Finish time: ____________ Date: ____________ Movements: ____________ Start time: ____________ Finish time: ____________ Date: ____________ Movements: ____________ Start time: ____________ Finish time: ____________  Date: ____________ Movements: ____________ Start time: ____________ Finish   time: ____________ Date: ____________ Movements: ____________ Start time: ____________ Finish time: ____________ Date: ____________ Movements: ____________ Start time: ____________ Finish time: ____________ Date: ____________ Movements: ____________ Start time: ____________ Finish time: ____________ Date: ____________ Movements: ____________ Start time: ____________ Finish time: ____________ Date: ____________ Movements: ____________ Start time: ____________ Finish time: ____________ Date: ____________ Movements: ____________ Start time: ____________ Finish time: ____________  Date: ____________ Movements: ____________ Start time: ____________ Finish time: ____________ Date: ____________ Movements: ____________ Start time: ____________ Finish time: ____________ Date: ____________ Movements: ____________ Start time: ____________ Finish time: ____________ Date: ____________ Movements: ____________ Start time: ____________ Finish time: ____________ Date: ____________ Movements: ____________ Start time: ____________ Finish time: ____________ Date: ____________ Movements: ____________ Start time: ____________ Finish time: ____________ Date: ____________ Movements: ____________ Start time: ____________ Finish time: ____________  Date: ____________ Movements: ____________ Start time: ____________ Finish time: ____________ Date: ____________  Movements: ____________ Start time: ____________ Finish time: ____________ Date: ____________ Movements: ____________ Start time: ____________ Finish time: ____________ Date: ____________ Movements: ____________ Start time: ____________ Finish time: ____________ Date: ____________ Movements: ____________ Start time: ____________ Finish time: ____________ Date: ____________ Movements: ____________ Start time: ____________ Finish time: ____________ Date: ____________ Movements: ____________ Start time: ____________ Finish time: ____________  Date: ____________ Movements: ____________ Start time: ____________ Finish time: ____________ Date: ____________ Movements: ____________ Start time: ____________ Finish time: ____________ Date: ____________ Movements: ____________ Start time: ____________ Finish time: ____________ Date: ____________ Movements: ____________ Start time: ____________ Finish time: ____________ Date: ____________ Movements: ____________ Start time: ____________ Finish time: ____________ Date: ____________ Movements: ____________ Start time: ____________ Finish time: ____________   This information is not intended to replace advice given to you by your health care provider. Make sure you discuss any questions you have with your health care provider.   Document Released: 06/13/2006 Document Revised: 06/04/2014 Document Reviewed: 03/10/2012 Elsevier Interactive Patient Education 2016 [employer] Braxton Hicks Contractions Contractions of the uterus can occur throughout pregnancy. Contractions are not always a sign that you are in labor.  WHAT ARE BRAXTON HICKS CONTRACTIONS?  Contractions that occur before labor are called Braxton Hicks contractions, or false labor. Toward the end of pregnancy (32-34 weeks), these contractions can develop more often and may become more forceful. This is not true labor because these contractions do not result in opening (dilatation) and thinning of  the cervix. They are sometimes difficult to tell apart from true labor because these contractions can be forceful and people have different pain tolerances. You should not feel embarrassed if you go to the hospital with false labor. Sometimes, the only way to tell if you are in true labor is for your health care provider to look for changes in the cervix. If there are no prenatal problems or other health problems associated with the pregnancy, it is completely safe to be sent home with false labor and await the onset of true labor. HOW CAN YOU TELL THE DIFFERENCE BETWEEN TRUE AND FALSE LABOR? False Labor  The contractions of false labor are usually shorter and not as hard as those of true labor.   The contractions are usually irregular.   The contractions are often felt in the front of the lower abdomen and in the groin.   The contractions may go away when you walk around or change positions while lying down.   The contractions get weaker and are shorter lasting as time goes on.   The contractions do not usually become progressively stronger, regular, and closer together as with true labor.  True Labor 5. Contractions in true   labor last 30-70 seconds, become very regular, usually become more intense, and increase in frequency.  6. The contractions do not go away with walking.  7. The discomfort is usually felt in the top of the uterus and spreads to the lower abdomen and low back.  8. True labor can be determined by your health care provider with an exam. This will show that the cervix is dilating and getting thinner.  WHAT TO REMEMBER  Keep up with your usual exercises and follow other instructions given by your health care provider.   Take medicines as directed by your health care provider.   Keep your regular prenatal appointments.   Eat and drink lightly if you think you are going into labor.   If Braxton Hicks contractions are making you uncomfortable:   Change  your position from lying down or resting to walking, or from walking to resting.   Sit and rest in a tub of warm water.   Drink 2-3 glasses of water. Dehydration may cause these contractions.   Do slow and deep breathing several times an hour.  WHEN SHOULD I SEEK IMMEDIATE MEDICAL CARE? Seek immediate medical care if:  Your contractions become stronger, more regular, and closer together.   You have fluid leaking or gushing from your vagina.   You have a fever.   You pass blood-tinged mucus.   You have vaginal bleeding.   You have continuous abdominal pain.   You have low back pain that you never had before.   You feel your baby's head pushing down and causing pelvic pressure.   Your baby is not moving as much as it used to.    This information is not intended to replace advice given to you by your health care provider. Make sure you discuss any questions you have with your health care provider.   Document Released: 05/14/2005 Document Revised: 05/19/2013 Document Reviewed: 02/23/2013 Elsevier Interactive Patient Education 2016 Elsevier Inc[employer]  

## 2015-11-08 NOTE — MAU Note (Signed)
Pt states she is having ctxs every 3 minutes. Pt denies LOF or vaginal bleeding. Fetus active.

## 2015-11-08 NOTE — Discharge Instructions (Signed)

## 2015-11-08 NOTE — MAU Note (Signed)
Pt to walk for 1 hour per orders.

## 2015-11-08 NOTE — Progress Notes (Signed)
Pt report contracting every 10-12 minutes and then it will go away "but I feel like something is happening" "

## 2015-11-09 ENCOUNTER — Inpatient Hospital Stay (HOSPITAL_COMMUNITY)
Admission: AD | Admit: 2015-11-09 | Discharge: 2015-11-12 | DRG: 775 | Disposition: A | Discharge status: Home / Self Care | Payer: 59 | Source: Ambulatory Visit | Attending: Obstetrics & Gynecology | Admitting: Obstetrics & Gynecology

## 2015-11-09 ENCOUNTER — Encounter (HOSPITAL_COMMUNITY): Payer: Self-pay

## 2015-11-09 DIAGNOSIS — O9962 Diseases of the digestive system complicating childbirth: Secondary | ICD-10-CM | POA: Diagnosis present

## 2015-11-09 DIAGNOSIS — Z3A39 39 weeks gestation of pregnancy: Secondary | ICD-10-CM | POA: Diagnosis not present

## 2015-11-09 DIAGNOSIS — Z283 Underimmunization status: Secondary | ICD-10-CM

## 2015-11-09 DIAGNOSIS — O9989 Other specified diseases and conditions complicating pregnancy, childbirth and the puerperium: Secondary | ICD-10-CM

## 2015-11-09 DIAGNOSIS — Z3400 Encounter for supervision of normal first pregnancy, unspecified trimester: Secondary | ICD-10-CM

## 2015-11-09 DIAGNOSIS — Z2839 Other underimmunization status: Secondary | ICD-10-CM

## 2015-11-09 DIAGNOSIS — Z3403 Encounter for supervision of normal first pregnancy, third trimester: Secondary | ICD-10-CM

## 2015-11-09 DIAGNOSIS — K589 Irritable bowel syndrome without diarrhea: Secondary | ICD-10-CM | POA: Diagnosis present

## 2015-11-09 DIAGNOSIS — O99891 Other specified diseases and conditions complicating pregnancy: Secondary | ICD-10-CM

## 2015-11-09 DIAGNOSIS — O09899 Supervision of other high risk pregnancies, unspecified trimester: Secondary | ICD-10-CM

## 2015-11-09 LAB — TYPE AND SCREEN
ABO/RH(D): O POS
Antibody Screen: NEGATIVE

## 2015-11-09 LAB — CBC
HCT: 34.8 % — ABNORMAL LOW (ref 36.0–46.0)
Hemoglobin: 12.1 g/dL (ref 12.0–15.0)
MCH: 29.3 pg (ref 26.0–34.0)
MCHC: 34.8 g/dL (ref 30.0–36.0)
MCV: 84.3 fL (ref 78.0–100.0)
Platelets: 260 10*3/uL (ref 150–400)
RBC: 4.13 MIL/uL (ref 3.87–5.11)
RDW: 14 % (ref 11.5–15.5)
WBC: 18.3 10*3/uL — ABNORMAL HIGH (ref 4.0–10.5)

## 2015-11-09 LAB — PROTEIN / CREATININE RATIO, URINE
Creatinine, Urine: 172 mg/dL (ref 20–320)
Protein Creatinine Ratio: 134 mg/g creat (ref 21–161)
Total Protein, Urine: 23 mg/dL (ref 5–24)

## 2015-11-09 LAB — ABO/RH: ABO/RH(D): O POS

## 2015-11-09 LAB — RPR: RPR Ser Ql: NONREACTIVE

## 2015-11-09 MED ORDER — OXYTOCIN 40 UNITS IN LACTATED RINGERS INFUSION - SIMPLE MED
2.5000 [IU]/h | INTRAVENOUS | Status: DC
  Filled 2015-11-09: qty 1000

## 2015-11-09 MED ORDER — OXYCODONE-ACETAMINOPHEN 5-325 MG PO TABS: 1.0000 | ORAL_TABLET | ORAL | Status: DC | PRN

## 2015-11-09 MED ORDER — LACTATED RINGERS IV SOLN
INTRAVENOUS | Status: DC
  Administered 2015-11-10: 03:00:00 via INTRAVENOUS

## 2015-11-09 MED ORDER — FLEET ENEMA 7-19 GM/118ML RE ENEM: 1.0000 | ENEMA | RECTAL | Status: DC | PRN

## 2015-11-09 MED ORDER — OXYCODONE-ACETAMINOPHEN 5-325 MG PO TABS: 2.0000 | ORAL_TABLET | ORAL | Status: DC | PRN

## 2015-11-09 MED ORDER — OXYTOCIN BOLUS FROM INFUSION: 500.0000 mL | INTRAVENOUS | Status: DC

## 2015-11-09 MED ORDER — LACTATED RINGERS IV SOLN
500.0000 mL | INTRAVENOUS | Status: DC | PRN
  Administered 2015-11-10: 1000 mL via INTRAVENOUS
  Administered 2015-11-10: 250 mL via INTRAVENOUS
  Administered 2015-11-10: 1000 mL via INTRAVENOUS

## 2015-11-09 MED ORDER — ONDANSETRON HCL 4 MG/2ML IJ SOLN: 4.0000 mg | Freq: Four times a day (QID) | INTRAMUSCULAR | Status: DC | PRN

## 2015-11-09 MED ORDER — OXYTOCIN 10 UNIT/ML IJ SOLN
INTRAMUSCULAR | Status: AC
  Filled 2015-11-09: qty 1

## 2015-11-09 MED ORDER — SOD CITRATE-CITRIC ACID 500-334 MG/5ML PO SOLN
30.0000 mL | ORAL | Status: DC | PRN
  Filled 2015-11-09: qty 15

## 2015-11-09 MED ORDER — LIDOCAINE HCL (PF) 1 % IJ SOLN
30.0000 mL | INTRAMUSCULAR | Status: AC | PRN
  Administered 2015-11-10: 30 mL via SUBCUTANEOUS
  Filled 2015-11-09: qty 30

## 2015-11-09 MED ORDER — ACETAMINOPHEN 325 MG PO TABS: 650.0000 mg | ORAL_TABLET | ORAL | Status: DC | PRN

## 2015-11-09 NOTE — Progress Notes (Signed)
Labor Progress Note Mauricia Areanna Kempen is a 35 y.o. G2P0010 at 3243w1d presented for active labor. S: Pt has increased frequency and intensity of "surges." No vaginal bleeding. Requests AROM.  O:  BP 131/70 mmHg  Pulse 70  Temp(Src) 98 F (36.7 C) (Oral)  Resp 20  Ht 5\' 7"  (1.702 m)  Wt 92.987 kg (205 lb)  BMI 32.10 kg/m2  LMP 02/11/2015 (Within Days) General: alert, cooperative and no distress EFM: 150/mod/+accels/no decels  CVE: Dilation: 8.5 Effacement (%): 90 Cervical Position: Middle Station: -2 Presentation: Vertex Exam by::  (Dr.Gonfa)   A&P: 35 y.o. G2P0010 2243w1d with active labor. #Labor: Progressing well. - AROM at 1649 #Pain: Well controlled. Pt continuing with plan for natural birth. #FWB: Category 1 strip #GBS: neg  Barnie Mortolleen Kinsey Cowsert, Med Student 5:00 PM

## 2015-11-09 NOTE — MAU Note (Signed)
Pt reports bloody show, contractions about every 3-4 minutes since about 9 am yesterday. Denies any problems with the pregnancy. Reports some decreased fetal movement this last hour.

## 2015-11-09 NOTE — Progress Notes (Signed)
Patient ID: Bianca Hodge, female   DOB: 12/25/1980, 35 y.o.   MRN: [id number] Labor Progress Note Bianca Hodge is a 35 y.o. G2P0010 at 7829w1d presented for SOL S: Kneeling using nitrous  O:  BP 103/63 mmHg  Pulse 97  Temp(Src) 98 F (36.7 C) (Oral)  Resp 22  Ht 5\' 7"  (1.702 m)  Wt 205 lb (92.987 kg)  BMI 32.10 kg/m2  SpO2 100%  LMP 02/11/2015 (Within Days) EFM: 145/mod/+accels, no decels  CVE: Dilation: Lip/rim Effacement (%): 100 Cervical Position: Middle Station: 0 Presentation: Vertex Exam by:: Margarit Minshall   A&P: 35 y.o. G2P0010 5829w1d for SOL #Labor: Progressing well.   #Pain: using nitrous #FWB: Cat I #GBS negative  Federico FlakeKimberly Niles Cecily Lawhorne, MD 9:43 PM

## 2015-11-09 NOTE — H&P (Signed)
Bianca Hodge is a 35 y.o. female G2P0010 @[redacted]w[redacted]d  by 1st trimester US presenting for active labor at term. She desires natural labor and has a written birth plan, which is scanned into the chart. She reports good fetal movement, denies LOF, vaginal bleeding, vaginal itching/burning, urinary symptoms, h/a, dizziness, n/v, or fever/chills.     Clinic  [facility]  Dating Early US 7.6 weeks on 04/04/15 Blood type: O/POS/-- (11/07 1056)   Genetic Screen 1 Screen:  negative  AFP: Declined   Antibody:NEG (11/07 1056)  Anatomic US Normal female Rubella: 0.82 (11/07 1056)  GTT Early:               Third trimester: 96 RPR: NON REAC (11/07 1056)   Flu vaccine  06/29/15 HBsAg: NEGATIVE (11/07 1056)   TDaP vaccine  08/31/15                          Rhogam: O pos HIV: NONREACTIVE (11/07 1056)   Baby Food Breast                     GBS:  negative  Contraception  Condoms Pap:04/04/15  Circumcision NA   [facility]   Support Person  Casimiro NeedleMarty O'Connor    Maternal Medical History:  Reason for admission: Contractions.  Nausea.  Contractions: Onset was 6-12 hours ago.   Frequency: regular.   Duration is approximately 1 minute.   Perceived severity is strong.    Fetal activity: Perceived fetal activity is normal.   Last perceived fetal movement was within the past hour.    Prenatal complications: no prenatal complications Prenatal Complications - Diabetes: none.    OB History    Gravida Para Term Preterm AB TAB SAB Ectopic Multiple Living   2    1 1          Past Medical History  Diagnosis Date  . Parasite infection 06-04-2014   Past Surgical History  Procedure Laterality Date  . Appendectomy     Family History: family history is not on file. Social History:  reports that she has never smoked. She does not have any smokeless tobacco history on file. She reports that she does not drink alcohol. Her drug history is not on file.   Prenatal Transfer  Tool  Maternal Diabetes: No Genetic Screening: Normal Maternal Ultrasounds/Referrals: Normal Fetal Ultrasounds or other Referrals:  None Maternal Substance Abuse:  No Significant Maternal Medications:  None Significant Maternal Lab Results:  Lab values include: Group B Strep negative Other Comments:  None  Review of Systems  Constitutional: Negative for fever, chills and malaise/fatigue.  Eyes: Negative for blurred vision.  Respiratory: Negative for cough and shortness of breath.   Cardiovascular: Negative for chest pain.  Gastrointestinal: Negative for heartburn, nausea and vomiting.  Genitourinary: Negative for dysuria, urgency and frequency.  Musculoskeletal: Negative.   Neurological: Negative for dizziness and headaches.  Psychiatric/Behavioral: Negative for depression.    Dilation: 5 Effacement (%): 100 Station: -2 Exam by:: C. Brewer RNC Blood pressure 126/66, pulse 69, temperature 98.1 F (36.7 C), temperature source Oral, resp. rate 18, height 5\' 7"  (1.702 m), weight 92.987 kg (205 lb), last menstrual period 02/11/2015. Maternal Exam:  Uterine Assessment: Contraction strength is moderate.  Contraction duration is 70 seconds. Contraction frequency is regular.   Abdomen: Fetal presentation: vertex  Cervix: Cervix evaluated by digital exam.     Fetal Exam Fetal Monitor Review:  Mode: ultrasound.   Baseline rate: 135.  Variability: moderate (6-25 bpm).   Pattern: accelerations present and no decelerations.    Fetal State Assessment: Category I - tracings are normal. Initial 20 minute continuous tracing obtained.  Plan for intermittent monitor with doppler per protocol unless contraindicated.  Physical Exam  Nursing note and vitals reviewed. Constitutional: She is oriented to person, place, and time. She appears well-developed and well-nourished.  Neck: Normal range of motion.  Cardiovascular: Normal rate, regular rhythm and normal heart sounds.   Respiratory:  Effort normal.  GI: Soft.  Musculoskeletal: Normal range of motion.  Neurological: She is alert and oriented to person, place, and time.  Skin: Skin is warm and dry.  Psychiatric: She has a normal mood and affect. Her behavior is normal. Judgment and thought content normal.    Prenatal labs: ABO, Rh: --/--/O POS (06/14 0612) Antibody: NEG (06/14 0612) Rubella: 0.82 (11/07 1056) RPR: NON REAC (04/05 1130)  HBsAg: NEGATIVE (11/07 1056)  HIV: NONREACTIVE (04/05 1130)  GBS: Negative (05/16 0000)   Assessment/Plan: G2P0010  by 1st trimester [place] Active labor at term GBS negative  Plans natural birth/using hypnobirthing techniques Expectant management  Anticipate NSVD   LEFTWICH-KIRBY, Anhthu Perdew 11/09/2015, 7:38 AM

## 2015-11-09 NOTE — Progress Notes (Signed)
Patient ID: [name], female   DOB: 06/28/1980, 35 y.o.   MRN: [id number] Labor Progress Note [name] is a 35 y.o. G2P0010 at 156w1d presented for SOL  S: Patient feeling the urge to push. Pushed with patient for about 30 minutes with out good descent and recheck showed anterior cervical lip had returned and could not be reduced.   O:  BP 123/71 mmHg  Pulse 62  Temp(Src) 97.5 F (36.4 C) (Oral)  Resp 22  Ht 5\' 7"  (1.702 m)  Wt 205 lb (92.987 kg)  BMI 32.10 kg/m2  SpO2 100%  LMP 02/11/2015 (Within Days) EFM: 145/mod/+accels, no decels  CVE: Dilation: Lip/rim (small anterior lip return) Dilation Complete Date: 11/09/15 Dilation Complete Time: 2216 Effacement (%): 100 Cervical Position: Middle Station: +1 Presentation: Vertex Exam by:: Sereena Marando   A&P: 35 y.o. G2P0010 466w1d for SOL #Labor: Progressing well.  Continue to provide labor support, no need for augmentation at this time.  #Pain: using nitrous #FWB: Cat I #GBS negative  Federico FlakeKimberly Niles Lakin Rhine, MD 11:09 PM

## 2015-11-09 NOTE — Progress Notes (Signed)
Pt may walk if desires, recheck in an hour

## 2015-11-09 NOTE — Progress Notes (Signed)
Labor Progress Note Bianca Hodge is a 35 y.o. G2P0010 at 1741w1d presented with spontaneous onset of labor. S:  Pt complains of increasing frequency and pain with "surges." No vaginal bleeding or LOF. Denies N/V.  O:  BP 110/60 mmHg  Pulse 60  Temp(Src) 97.9 F (36.6 C) (Oral)  Resp 18  Ht 5\' 7"  (1.702 m)  Wt 92.987 kg (205 lb)  BMI 32.10 kg/m2  LMP 02/11/2015 (Within Days) General: alert, cooperative and no distress Intermittent monitoring: EFM: 140/mod/+accels/no decels  CVE: Dilation: 6.5 Effacement (%): 80, 90 Cervical Position: Middle Station: -2 Presentation: Vertex Exam by:: Lorretta Harp. Brown RNC   A&P: 35 y.o. G2P0010 2541w1d with SOL. #Labor: Progressing - pt. Encouraged to change positions frequently and ambulate when possible. #Pain: Requesting natural birth #FWB: Category 1 strip #GBS:  neg  Barnie Mortolleen Nickolus Wadding, Med Student 2:36 PM

## 2015-11-09 NOTE — Progress Notes (Signed)
Labor Progress Note Bianca Hodge is a 35 y.o. G2P0010 at 8062w1d presented for SOL. She is doing hypnobirthing.  S: says she contraction is getting stronger. No other complaint.  O:  BP 126/66 mmHg  Pulse 69  Temp(Src) 98.1 F (36.7 C) (Oral)  Resp 18  Ht 5\' 7"  (1.702 m)  Wt 205 lb (92.987 kg)  BMI 32.10 kg/m2  LMP 02/11/2015 (Within Days) EFM: intermittent monitoring. 135 and reactive  CVE: Dilation: 5 Effacement (%): 100 Cervical Position: Middle Station: -2 Presentation: Vertex Exam by:: Earnest Bailey. Brewer RNC   A&P: 35 y.o. G2P0010 5562w1d admitted for SOL. Patient is doing hypnobirthing. #Labor: progressing #Pain: none. Says she could do nitrous if pain is not bearable #FWB: 135 and reactive on intermittent monitoring #GBS: negative  Almon Herculesaye T Zen Felling, MD 11:15 AM

## 2015-11-10 ENCOUNTER — Inpatient Hospital Stay (HOSPITAL_COMMUNITY): Payer: 59 | Admitting: Anesthesiology

## 2015-11-10 ENCOUNTER — Encounter (HOSPITAL_COMMUNITY): Payer: Self-pay | Admitting: Anesthesiology

## 2015-11-10 DIAGNOSIS — Z3A39 39 weeks gestation of pregnancy: Secondary | ICD-10-CM

## 2015-11-10 MED ORDER — TERBUTALINE SULFATE 1 MG/ML IJ SOLN
0.2500 mg | Freq: Once | INTRAMUSCULAR | Status: DC | PRN
  Filled 2015-11-10: qty 1

## 2015-11-10 MED ORDER — SODIUM BICARBONATE 8.4 % IV SOLN
INTRAVENOUS | Status: DC | PRN
  Administered 2015-11-10 (×2): 5 mL via EPIDURAL

## 2015-11-10 MED ORDER — EPHEDRINE 5 MG/ML INJ
10.0000 mg | INTRAVENOUS | Status: DC | PRN
  Filled 2015-11-10: qty 2

## 2015-11-10 MED ORDER — FENTANYL 2.5 MCG/ML BUPIVACAINE 1/10 % EPIDURAL INFUSION (WH - ANES)
14.0000 mL/h | INTRAMUSCULAR | Status: DC | PRN
  Administered 2015-11-10 (×3): 14 mL/h via EPIDURAL
  Filled 2015-11-10 (×2): qty 125

## 2015-11-10 MED ORDER — PHENYLEPHRINE 40 MCG/ML (10ML) SYRINGE FOR IV PUSH (FOR BLOOD PRESSURE SUPPORT)
80.0000 ug | PREFILLED_SYRINGE | INTRAVENOUS | Status: DC | PRN
  Filled 2015-11-10: qty 5
  Filled 2015-11-10 (×2): qty 10

## 2015-11-10 MED ORDER — DIBUCAINE 1 % RE OINT
1.0000 "application " | TOPICAL_OINTMENT | RECTAL | Status: DC | PRN
  Administered 2015-11-10: 1 via RECTAL
  Filled 2015-11-10 (×2): qty 28

## 2015-11-10 MED ORDER — COCONUT OIL OIL
1.0000 "application " | TOPICAL_OIL | Status: DC | PRN
  Filled 2015-11-10: qty 120

## 2015-11-10 MED ORDER — BENZOCAINE-MENTHOL 20-0.5 % EX AERO
1.0000 "application " | INHALATION_SPRAY | CUTANEOUS | Status: DC | PRN
  Administered 2015-11-10: 1 via TOPICAL
  Filled 2015-11-10 (×2): qty 56

## 2015-11-10 MED ORDER — PRENATAL MULTIVITAMIN CH
1.0000 | ORAL_TABLET | Freq: Every day | ORAL | Status: DC
  Administered 2015-11-11 – 2015-11-12 (×2): 1 via ORAL
  Filled 2015-11-10 (×2): qty 1

## 2015-11-10 MED ORDER — PHENYLEPHRINE 40 MCG/ML (10ML) SYRINGE FOR IV PUSH (FOR BLOOD PRESSURE SUPPORT)
80.0000 ug | PREFILLED_SYRINGE | INTRAVENOUS | Status: DC | PRN
  Administered 2015-11-10: 80 ug via INTRAVENOUS
  Filled 2015-11-10: qty 5

## 2015-11-10 MED ORDER — ACETAMINOPHEN 325 MG PO TABS: 650.0000 mg | ORAL_TABLET | ORAL | Status: DC | PRN

## 2015-11-10 MED ORDER — OXYTOCIN 40 UNITS IN LACTATED RINGERS INFUSION - SIMPLE MED
1.0000 m[IU]/min | INTRAVENOUS | Status: DC
  Administered 2015-11-10: 2 m[IU]/min via INTRAVENOUS
  Administered 2015-11-10: 6 m[IU]/min via INTRAVENOUS

## 2015-11-10 MED ORDER — OXYCODONE HCL 5 MG PO TABS: 10.0000 mg | ORAL_TABLET | ORAL | Status: DC | PRN

## 2015-11-10 MED ORDER — MEASLES, MUMPS & RUBELLA VAC ~~LOC~~ INJ
0.5000 mL | INJECTION | Freq: Once | SUBCUTANEOUS | Status: AC
  Administered 2015-11-12: 0.5 mL via SUBCUTANEOUS
  Filled 2015-11-10 (×2): qty 0.5

## 2015-11-10 MED ORDER — WITCH HAZEL-GLYCERIN EX PADS
1.0000 "application " | MEDICATED_PAD | CUTANEOUS | Status: DC | PRN
  Administered 2015-11-10: 1 via TOPICAL

## 2015-11-10 MED ORDER — TETANUS-DIPHTH-ACELL PERTUSSIS 5-2.5-18.5 LF-MCG/0.5 IM SUSP
0.5000 mL | Freq: Once | INTRAMUSCULAR | Status: DC
  Filled 2015-11-10: qty 0.5

## 2015-11-10 MED ORDER — DIPHENHYDRAMINE HCL 50 MG/ML IJ SOLN: 12.5000 mg | INTRAMUSCULAR | Status: DC | PRN

## 2015-11-10 MED ORDER — LACTATED RINGERS IV SOLN
500.0000 mL | Freq: Once | INTRAVENOUS | Status: AC
  Administered 2015-11-10: 500 mL via INTRAVENOUS

## 2015-11-10 MED ORDER — IBUPROFEN 600 MG PO TABS
600.0000 mg | ORAL_TABLET | Freq: Four times a day (QID) | ORAL | Status: DC
  Administered 2015-11-10 – 2015-11-12 (×8): 600 mg via ORAL
  Filled 2015-11-10 (×8): qty 1

## 2015-11-10 MED ORDER — OXYCODONE HCL 5 MG PO TABS
5.0000 mg | ORAL_TABLET | ORAL | Status: DC | PRN
Start: 2015-11-10 — End: 2015-11-12

## 2015-11-10 MED ORDER — SENNOSIDES-DOCUSATE SODIUM 8.6-50 MG PO TABS
2.0000 | ORAL_TABLET | ORAL | Status: DC
  Administered 2015-11-11 (×2): 2 via ORAL
  Filled 2015-11-10 (×3): qty 2

## 2015-11-10 MED ORDER — ONDANSETRON HCL 4 MG PO TABS: 4.0000 mg | ORAL_TABLET | ORAL | Status: DC | PRN

## 2015-11-10 MED ORDER — SIMETHICONE 80 MG PO CHEW: 80.0000 mg | CHEWABLE_TABLET | ORAL | Status: DC | PRN

## 2015-11-10 MED ORDER — DIPHENHYDRAMINE HCL 25 MG PO CAPS: 25.0000 mg | ORAL_CAPSULE | Freq: Four times a day (QID) | ORAL | Status: DC | PRN

## 2015-11-10 MED ORDER — ONDANSETRON HCL 4 MG/2ML IJ SOLN: 4.0000 mg | INTRAMUSCULAR | Status: DC | PRN

## 2015-11-10 MED ORDER — ZOLPIDEM TARTRATE 5 MG PO TABS: 5.0000 mg | ORAL_TABLET | Freq: Every evening | ORAL | Status: DC | PRN

## 2015-11-10 MED ORDER — MORPHINE SULFATE (PF) 4 MG/ML IV SOLN
2.0000 mg | Freq: Once | INTRAVENOUS | Status: DC | PRN
  Filled 2015-11-10: qty 1

## 2015-11-10 MED ORDER — MORPHINE SULFATE (PF) 4 MG/ML IV SOLN
2.0000 mg | Freq: Once | INTRAVENOUS | Status: AC | PRN
  Administered 2015-11-10: 2 mg via INTRAMUSCULAR

## 2015-11-10 NOTE — Progress Notes (Signed)
Patient ID: Bianca Hodge, female   DOB: 02/18/1981, 35 y.o.   MRN: [id number]   Labor Progress Note Bianca Hodge is a 35 y.o. G2P0010 at 331w1d presented for SOL  S:s/p epidural. Got some rest and feeling better.   O:  BP 127/72 mmHg  Pulse 59  Temp(Src) 98 F (36.7 C) (Oral)  Resp 18  Ht 5\' 7"  (1.702 m)  Wt 205 lb (92.987 kg)  BMI 32.10 kg/m2  SpO2 99%  LMP 02/11/2015 (Within Days) EFM: 150/mod/+accels, variable decelx1 to 120 with return to baseline and excellent beat to beat variability afterwards  CVE: Dilation: 10 Dilation Complete Date: 11/10/15 Dilation Complete Time: 0608 Effacement (%): 100 Cervical Position: Middle Station: 0 Presentation: Vertex Exam by:: Anahit Klumb  Infant is LOP (facing mothers right) Pushed for 15 minutes with patient with good but slow descent.  Meconium stained fluid present.   A&P: 35 y.o. G2P0010 6631w1d for SOL #Labor: Protracted first stage. Infant is OP. Will allow to push for 1 hour, if minimal descent consider manual rotation.  #Pain: Epidural #FWB: Cat I #GBS negative  Federico FlakeKimberly Niles Quaneshia Wareing, MD 6:54 AM

## 2015-11-10 NOTE — Lactation Note (Signed)
This note was copied from a baby's chart. Lactation Consultation Note  Patient Name: Bianca Hodge Fabel ZOXWR'UToday's Date: 11/10/2015 Reason for consult: Initial assessment Baby at 5 hr of life. Mom denies breast or nipple pain. She had questions about latching. Mom has firm breast with short shaft nipples. Visual assessment only baby may had short/tight lingual frenulum. Discussed baby behavior, feeding frequency, baby belly size, voids, wt loss, breast changes, and nipple care. Mom stated she can manually express and has spoon in room. Given lactation handouts. Aware of OP services and support group.   Maternal Data Has patient been taught Hand Expression?: Yes  Feeding Feeding Type: Breast Fed Length of feed: 10 min  LATCH Score/Interventions Latch: Repeated attempts needed to sustain latch, nipple held in mouth throughout feeding, stimulation needed to elicit sucking reflex. Intervention(s): Adjust position;Assist with latch;Breast compression  Audible Swallowing: A few with stimulation Intervention(s): Skin to skin;Hand expression Intervention(s): Alternate breast massage  Type of Nipple: Everted at rest and after stimulation  Comfort (Breast/Nipple): Soft / non-tender     Hold (Positioning): Full assist, staff holds infant at breast Intervention(s): Support Pillows;Position options  LATCH Score: 6  Lactation Tools Discussed/Used WIC Program: No   Consult Status Consult Status: Follow-up Date: 11/11/15 Follow-up type: In-patient    Rulon Eisenmengerlizabeth E Tiersa Dayley 11/10/2015, 6:36 PM

## 2015-11-10 NOTE — Progress Notes (Signed)
Patient ID: Bianca Hodge, female   DOB: 11/23/1980, 35 y.o.   MRN: [id number]   Labor Progress Note Bianca Hodge is a 35 y.o. G2P0010 at 5280w1d presented for SOL  S:Patient its reaching her limit. Desires improved pain control.   O:  BP 120/66 mmHg  Pulse 68  Temp(Src) 97.8 F (36.6 C) (Oral)  Resp 20  Ht 5\' 7"  (1.702 m)  Wt 205 lb (92.987 kg)  BMI 32.10 kg/m2  SpO2 99%  LMP 02/11/2015 (Within Days) EFM: 135/mod/+accels, no decels  CVE: Dilation: Lip/rim Dilation Complete Date: 11/09/15 Dilation Complete Time: 2216 Effacement (%): 100 Cervical Position: Middle Station: 0 Presentation: Vertex Exam by:: Viona GilmoreS Moyer   A&P: 35 y.o. G2P0010 7980w1d for SOL #Labor: Protracted first stage. Suspect OP. Will start pitocin after placement and concern IUPC #Pain: discussed epidural and patient agrees to placement.  #FWB: Cat I #GBS negative  Federico FlakeKimberly Niles Mykala Mccready, MD 2:41 AM

## 2015-11-10 NOTE — Progress Notes (Signed)
Labor Progress Note Bianca Hodge is a 35 y.o. G2P0010 at 5674w2d presented for labor S:  Pushing x2 hrs, only 1 hr effectively (per RN). Epidural in place but some pain in Rt groin. Feels tired.   O:  BP 126/63 mmHg  Pulse 62  Temp(Src) 98.1 F (36.7 C) (Oral)  Resp 18  Ht 5\' 7"  (1.702 m)  Wt 205 lb (92.987 kg)  BMI 32.10 kg/m2  SpO2 99%  LMP 02/11/2015 (Within Days) EFM: baseline 150 bpm/mod variability/no accels/ variable decels, occ. late  Toco: q2 SVE: 10/100/+1, ROP Pitocin: 10 mu/min  A/P: 35 y.o. G2P0010 2874w2d  1. Labor: second stage-protracted 2. FWB: Cat II 3. Pain: epidural 4. GBS negative Redose epidural, rest period with peanut ball to facilitate rotation then resume pushing after ~1 hr. Anticipate SVD.  Donette LarryMelanie Josel Keo, CNM 9:32 AM

## 2015-11-10 NOTE — Anesthesia Preprocedure Evaluation (Signed)

## 2015-11-10 NOTE — Anesthesia Postprocedure Evaluation (Signed)
Anesthesia Post Note  Patient: Mauricia Areanna Cervone  Procedure(s) Performed: * No procedures listed *  Patient location during evaluation: Mother Baby Anesthesia Type: Epidural Level of consciousness: awake and alert Pain management: satisfactory to patient Vital Signs Assessment: post-procedure vital signs reviewed and stable Respiratory status: respiratory function stable Cardiovascular status: stable Postop Assessment: no headache, no backache, epidural receding, patient able to bend at knees, no signs of nausea or vomiting and adequate PO intake Anesthetic complications: no Comments: Comfort level was assessed by AnesthesiaTeam and the patient was pleased with the care, interventions, and services provided by the Department of Anesthesia.     Last Vitals:  Filed Vitals:   11/10/15 1515 11/10/15 1615  BP: 112/55 123/62  Pulse: 57 70  Temp: 37.2 C 37.2 C  Resp: 18 18    Last Pain:  Filed Vitals:   11/10/15 1753  PainSc: 0-No pain   Pain Goal: Patients Stated Pain Goal: 10 (11/09/15 [id number])               Karleen DolphinFUSSELL,Ruben Mahler

## 2015-11-10 NOTE — Anesthesia Procedure Notes (Signed)
Epidural Patient location during procedure: OB Start time: 11/10/2015 3:00 AM End time: 11/10/2015 3:15 AM  Staffing Anesthesiologist: Leilani AbleHATCHETT, FRANKLIN Performed by: anesthesiologist   Preanesthetic Checklist Completed: patient identified, site marked, surgical consent, pre-op evaluation, timeout performed, IV checked, risks and benefits discussed and monitors and equipment checked  Epidural Patient position: sitting Prep: ChloraPrep Patient monitoring: heart rate, continuous pulse ox and blood pressure Approach: midline Location: L3-L4 Injection technique: LOR saline  Needle:  Needle type: Tuohy  Needle gauge: 17 G Needle length: 9 cm Catheter type: closed end flexible Catheter size: 20 Guage Test dose: negative and 1.5% lidocaine  Assessment Events: blood not aspirated, injection not painful, no injection resistance and no paresthesia  Additional Notes Reason for block:procedure for pain

## 2015-11-11 LAB — CBC
HCT: 29 % — ABNORMAL LOW (ref 36.0–46.0)
Hemoglobin: 9.8 g/dL — ABNORMAL LOW (ref 12.0–15.0)
MCH: 29.4 pg (ref 26.0–34.0)
MCHC: 33.8 g/dL (ref 30.0–36.0)
MCV: 87.1 fL (ref 78.0–100.0)
Platelets: 199 10*3/uL (ref 150–400)
RBC: 3.33 MIL/uL — ABNORMAL LOW (ref 3.87–5.11)
RDW: 14.6 % (ref 11.5–15.5)
WBC: 14.7 10*3/uL — ABNORMAL HIGH (ref 4.0–10.5)

## 2015-11-11 NOTE — Progress Notes (Signed)
Post Partum Day 1 Subjective:  Bianca Hodge is a 35 y.o. G2P1011 4937w2d s/p SVD.  No acute events overnight.  Pt denies problems with ambulating, voiding or po intake.  She denies nausea or vomiting.  Pain is well controlled.  She has had flatus.  Lochia Minimal.  Plan for birth control is condoms.  Method of Feeding: breast.  Objective: Blood pressure 109/60, pulse 64, temperature 98.9 F (37.2 C), temperature source Oral, resp. rate 18, height 5\' 7"  (1.702 m), weight 92.987 kg (205 lb), last menstrual period 02/11/2015, SpO2 99 %, unknown if currently breastfeeding.  Physical Exam:  General: alert, cooperative and no distress Chest: normal WOB Heart: Regular rate Abdomen: +BS, soft, mild TTP (appropriate) DVT Evaluation: no evidence of DVT seen on physical exam. Extremities: mild edema   Recent Labs  11/09/15 0611 11/11/15 0552  HGB 12.1 9.8*  HCT 34.8* 29.0*    Assessment/Plan:  ASSESSMENT: Bianca Hodge is a 35 y.o. G2P1011 2237w2d s/p SVD. Patient is doing well but prefers to stay one more day.  Plan for discharge tomorrow Continue routine PP care Breastfeeding support PRN

## 2015-11-11 NOTE — Progress Notes (Signed)
Post Partum Day 1 Subjective: no complaints, up ad lib, voiding and tolerating PO, small lochia, plans to breastfeed, condoms  Objective: Blood pressure 109/60, pulse 64, temperature 98.9 F (37.2 C), temperature source Oral, resp. rate 18, height 5\' 7"  (1.702 m), weight 92.987 kg (205 lb), last menstrual period 02/11/2015, SpO2 99 %, unknown if currently breastfeeding.  Physical Exam:  General: alert, cooperative and no distress Lochia:normal flow Chest: CTAB Heart: RRR no m/r/g Abdomen: +BS, soft, nontender,  Uterine Fundus: firm DVT Evaluation: No evidence of DVT seen on physical exam. Extremities: trace edema   Recent Labs  11/09/15 0611 11/11/15 0552  HGB 12.1 9.8*  HCT 34.8* 29.0*    Assessment/Plan: Plan for discharge tomorrow, Breastfeeding and Lactation consult   LOS: 2 days   CRESENZO-DISHMAN,Alexxia Stankiewicz 11/11/2015, 7:51 AM

## 2015-11-12 MED ORDER — IBUPROFEN 600 MG PO TABS: 600.0000 mg | ORAL_TABLET | Freq: Four times a day (QID) | ORAL | Status: DC

## 2015-11-12 NOTE — Discharge Instructions (Signed)

## 2015-11-12 NOTE — Discharge Summary (Signed)
OB Discharge Summary     Patient Name: Bianca Hodge DOB: [birth year] MRN: [id number]  Date of admission: 11/09/2015 Delivering MD: Wendee Beavers T   Date of discharge: 11/12/2015  Admitting diagnosis: 39 WEEKS CTX BLOODY SHOW Intrauterine pregnancy: [redacted]w[redacted]d    Secondary diagnosis:  Principal Problem:   NSVD (normal spontaneous vaginal delivery) Active Problems:   Irritable bowel syndrome (IBS)   Supervision of low-risk first pregnancy   Rubella non-immune status, antepartum   Normal labor and delivery  Additional problems: None     Discharge diagnosis: Term Pregnancy Delivered                                                                                                Post partum procedures:MMR  Augmentation: AROM and Pitocin  Complications: None  Hospital course:  Onset of Labor With Vaginal Delivery     35y.o. yo G2P1011 at 332w2das admitted in Latent Labor on 11/09/2015. Patient had an uncomplicated labor course as follows: Patient progressed to 9.5 cm using nitrous and natural labor coping. However due to protracted first stage and OP presenting with asynclitic lie the patient opted for an epidural. She progressed to complete and positional changes helped to alleviate the malpresentation.  Membrane Rupture Time/Date: 4:49 PM ,11/09/2015   Intrapartum Procedures: Episiotomy: None [1]                                         Lacerations:  2nd degree [3]  Patient had a delivery of a Viable infant. 11/10/2015  Information for the patient's newborn:  DaDylana, [email or web address][[id number]]Delivery Method: Vaginal, Spontaneous Delivery (Filed from Delivery Summary)    Pateint had an uncomplicated postpartum course.  She is ambulating, tolerating a regular diet, passing flatus, and urinating well. Patient is discharged home in stable condition on 11/12/2015.    Physical exam  Filed Vitals:   11/10/15 1615 11/10/15 2045 11/11/15 1031 11/11/15 1715  BP: 123/62 109/60 117/66  112/70  Pulse: 70 64 72 56  Temp: 99 F (37.2 C) 98.9 F (37.2 C) 99.9 F (37.7 C) 98.5 F (36.9 C)  TempSrc: Oral Oral Oral Oral  Resp: _0 Height:      Weight:      SpO2:       General: alert, cooperative and no distress Lochia: appropriate Uterine Fundus: firm Incision: Healing well with no significant drainage, No significant erythema, Dressing is clean, dry, and intact DVT Evaluation: No evidence of DVT seen on physical exam. Negative Homan's sign. No cords or calf tenderness. Labs: Lab Results  Component Value Date   WBC 14.7* 11/11/2015   HGB 9.8* 11/11/2015   HCT 29.0* 11/11/2015   MCV 87.1 11/11/2015   PLT 199 11/11/2015   CMP Latest Ref Rng 11/08/2015  Glucose 65 - 99 mg/dL 72  BUN 7 - 25 mg/dL 9  Creatinine 0.50 - 1.10 mg/dL 0.55  Sodium 135 - 146 mmol/L 134(L)  Potassium 3.5 -  5.3 mmol/L 4.0  Chloride 98 - 110 mmol/L 105  CO2 20 - 31 mmol/L 21  Calcium 8.6 - 10.2 mg/dL 9.0  Total Protein 6.1 - 8.1 g/dL 5.7(L)  Total Bilirubin 0.2 - 1.2 mg/dL 0.3  Alkaline Phos 33 - 115 U/L 163(H)  AST 10 - 30 U/L 19  ALT 6 - 29 U/L 20    Discharge instruction: per After Visit Summary and "Baby and Me Booklet".  After visit meds:    Medication List    TAKE these medications        docusate sodium 100 MG capsule  Commonly known as:  COLACE  Take 100 mg by mouth daily.     ibuprofen 600 MG tablet  Commonly known as:  ADVIL,MOTRIN  Take 1 tablet (600 mg total) by mouth every 6 (six) hours.      ASK your doctor about these medications        PRENATAL 1 30-0.975-200 MG Caps  Take 1 tablet by mouth daily.     psyllium 58.6 % powder  Commonly known as:  METAMUCIL  Take 1 packet by mouth daily as needed (bm).     SOLUBLE FIBER/PROBIOTICS PO  Take 2 tablets by mouth daily.        Diet: routine diet  Activity: Advance as tolerated. Pelvic rest for 6 weeks.   Outpatient follow up:6 weeks Follow up Appt:Future Appointments Date Time Provider  [place]  11/22/2015 10:40 AM Jorje Guild, NP [facility]   Follow up Visit:No Follow-up on file.  Postpartum contraception: Condoms  Newborn Data: Live born female  Birth Weight: 6 lb 9.3 oz (2985 g) APGAR: 3, 8  Baby Feeding: Breast Disposition:home with mother   11/12/2015 Caren Macadam, MD

## 2015-11-12 NOTE — Lactation Note (Signed)
This note was copied from a baby's chart. Lactation Consultation Note  Patient Name: Bianca Hodge Reason for consult: Follow-up assessment  Baby is 47 hours old , 5% weight loss, Bili check - at 35 hours - 4.2  Breast feeding consistently , Latch scores - 7-6-6-7, 10 X 2.  Voids and stools adequate for age.  [facility] - [facility] awake and hungry. Mom independently latched the abby in cradle position.  Baby took a few minutes to latch with depth and initially mom feeling discomfort.  Baby eventually obtained the depth, multiply swallows noted.  LC discussed the importance of obtaining the depth quickly so the baby  isn't nibbling her way onto the  Breast and also to decrease the potential for sore nipples.  Discussed nutritive vs non - nutritive feeding patterns and be ware when the baby is non - nutritive the baby isn't getting Much milk. Also reviewed babies signals to of being satisfied, body language.  Per mom was sore during the night , better now. LC reviewed sore nipple and engorgement prevention and tx.  Mom already has a hand pump and a DEBP at home.  Baby still feeding at the end of the consult .   Mother informed of post-discharge support and given phone number to the lactation department, including services for  phone call assistance; out-patient appointments; and breastfeeding support group. List of other breastfeeding resources  in the community given in the handout. Encouraged mother to call for problems or concerns related to breastfeeding.   Maternal Data    Feeding Feeding Type: Breast Fed Length of feed:  (still feeding at 10 mins )  LATCH Score/Interventions Latch: Grasps breast easily, tongue down, lips flanged, rhythmical sucking. Intervention(s): Adjust position;Assist with latch;Breast massage;Breast compression  Audible Swallowing: Spontaneous and intermittent  Type of Nipple: Everted at rest and after stimulation  Comfort  (Breast/Nipple): Soft / non-tender     Hold (Positioning): Assistance needed to correctly position infant at breast and maintain latch. Intervention(s): Breastfeeding basics reviewed;Support Pillows;Position options;Skin to skin  LATCH Score: 9  Lactation Tools Discussed/Used Tools: Pump Breast pump type: Manual   Consult Status Consult Status: Complete Date: 11/12/15    Kathrin Greathouseorio, [name], 1:01 PM

## 2015-11-15 ENCOUNTER — Encounter: Payer: 59 | Admitting: Family Medicine

## 2015-11-18 ENCOUNTER — Inpatient Hospital Stay (HOSPITAL_COMMUNITY)
Admission: AD | Admit: 2015-11-18 | Discharge: 2015-11-18 | Disposition: A | Discharge status: Home / Self Care | Payer: 59 | Source: Ambulatory Visit | Attending: Family Medicine | Admitting: Family Medicine

## 2015-11-18 ENCOUNTER — Encounter (HOSPITAL_COMMUNITY): Payer: Self-pay | Admitting: *Deleted

## 2015-11-18 LAB — CBC
HCT: 35.8 % — ABNORMAL LOW (ref 36.0–46.0)
Hemoglobin: 11.9 g/dL — ABNORMAL LOW (ref 12.0–15.0)
MCH: 29.1 pg (ref 26.0–34.0)
MCHC: 33.2 g/dL (ref 30.0–36.0)
MCV: 87.5 fL (ref 78.0–100.0)
Platelets: 393 10*3/uL (ref 150–400)
RBC: 4.09 MIL/uL (ref 3.87–5.11)
RDW: 14 % (ref 11.5–15.5)
WBC: 10.2 10*3/uL (ref 4.0–10.5)

## 2015-11-18 LAB — TYPE AND SCREEN
ABO/RH(D): O POS
Antibody Screen: NEGATIVE

## 2015-11-18 NOTE — MAU Note (Signed)
Pt presents to MAU with complaints of heavy vaginal bleeding. Delivered vaginally 11/10/15.

## 2015-11-18 NOTE — Discharge Instructions (Signed)

## 2015-11-18 NOTE — MAU Provider Note (Signed)
History     CSN: [id number]  Arrival date and time: 11/18/15 1729   First Provider Initiated Contact with Patient 11/18/15 1745      Chief Complaint  Patient presents with  . Vaginal Bleeding  . Abdominal Cramping   HPI Bianca Hodge is a 35 y.o. G2P1011 s/p NSVD on 11/10/15 who presents to MAU today with complaint of heavy vaginal bleeding. The patient states that she had bleeding that lightened up over the first 3-4 days PP and then bleeding had almost stopped until last night. She noted that she had sharp abdominal pain while breastfeeding and that is when the bleeding had resumed. She states heavier bleeding today. She denies clots. She has used ~ 6-8 pads today and blood is bright red. She denies fever. She would also like to have her stitches looked at, she had a 2nd degree tear and is having discomfort. She is breastfeeding and baby is latching well. Hgb was 9.8 at discharge after delivery.   OB History    Gravida Para Term Preterm AB TAB SAB Ectopic Multiple Living   2 1 1  1 1    0 1      Past Medical History  Diagnosis Date  . Parasite infection 06-04-2014    Past Surgical History  Procedure Laterality Date  . Appendectomy      History reviewed. No pertinent family history.  Social History  Substance Use Topics  . Smoking status: Never Smoker   . Smokeless tobacco: None  . Alcohol Use: No    Allergies:  Allergies  Allergen Reactions  . Gluten Meal     Prescriptions prior to admission  Medication Sig Dispense Refill Last Dose  . dibucaine (NUPERCAINAL) 1 % OINT Place 1 application rectally as needed for hemorrhoids.   11/17/2015 at Unknown time  . docusate sodium (COLACE) 100 MG capsule Take 100 mg by mouth daily.   11/17/2015 at Unknown time  . ibuprofen (ADVIL,MOTRIN) 600 MG tablet Take 1 tablet (600 mg total) by mouth every 6 (six) hours. 60 tablet 0 11/18/2015 at Unknown time  . Prenatal MV-Min-Fe Fum-FA-DHA (PRENATAL 1) 30-0.975-200 MG CAPS Take 1  tablet by mouth daily.    11/18/2015 at Unknown time  . Probiotic Product (SOLUBLE FIBER/PROBIOTICS PO) Take 2 tablets by mouth daily.   11/18/2015 at Unknown time  . witch hazel-glycerin (TUCKS) pad Apply 1 application topically as needed for itching or hemorrhoids.   Past Week at Unknown time    Review of Systems  Constitutional: Negative for fever and malaise/fatigue.  Gastrointestinal: Positive for abdominal pain.  Genitourinary:       + vaginal bleeding  Neurological: Negative for dizziness, loss of consciousness and weakness.   Physical Exam   Blood pressure 125/73, pulse 65, temperature 98.3 F (36.8 C), resp. rate 18, unknown if currently breastfeeding.  Physical Exam  Nursing note and vitals reviewed. Constitutional: She is oriented to person, place, and time. She appears well-developed and well-nourished. No distress.  HENT:  Head: Normocephalic and atraumatic.  Cardiovascular: Normal rate.   Respiratory: Effort normal.  GI: Soft. She exhibits no distension and no mass. There is tenderness (mild). There is no rebound and no guarding.  Genitourinary:     Neurological: She is alert and oriented to person, place, and time.  Skin: Skin is warm and dry. No erythema.  Psychiatric: She has a normal mood and affect.    Results for orders placed or performed during the hospital encounter of 11/18/15 (from  the past 24 hour(s))  CBC     Status: Abnormal   Collection Time: 11/18/15  6:05 PM  Result Value Ref Range   WBC 10.2 4.0 - 10.5 K/uL   RBC 4.09 3.87 - 5.11 MIL/uL   Hemoglobin 11.9 (L) 12.0 - 15.0 g/dL   HCT 09.835.8 (L) 11.936.0 - 14.746.0 %   MCV 87.5 78.0 - 100.0 fL   MCH 29.1 26.0 - 34.0 pg   MCHC 33.2 30.0 - 36.0 g/dL   RDW 82.914.0 56.211.5 - 13.015.5 %   Platelets 393 150 - 400 K/uL    MAU Course  Procedures None  MDM CBC and Type&Screen ordered today  Scant blood on pad at first exam at 1800, will recheck after CBC results and patient completes breastfeeding.  Hgb has  im[facility] significantly [facility] Minimal bleeding noted after ~ 40 minutes and breastfeeding.  Assessment and Plan  A: PPD #8 s/p NSVD Vaginal bleeding   P: Discharge home Continue PNV and Ibuprofen as directed  Bleeding precautions discussed Patient advised to follow-up with [facility] as planned for routine PP check  Patient may return to MAU as needed or if her condition were to change or worsen   Marny LowensteinJulie N Natsumi Whitsitt, PA-C  11/18/2015, 6:40 PM

## 2015-11-22 ENCOUNTER — Encounter: Payer: 59 | Admitting: Student

## 2016-01-05 ENCOUNTER — Ambulatory Visit (INDEPENDENT_AMBULATORY_CARE_PROVIDER_SITE_OTHER): Payer: 59 | Admitting: Family Medicine

## 2016-01-05 VITALS — Wt 179.4 lb

## 2016-01-05 DIAGNOSIS — K640 First degree hemorrhoids: Secondary | ICD-10-CM

## 2016-01-05 DIAGNOSIS — Z30011 Encounter for initial prescription of contraceptive pills: Secondary | ICD-10-CM

## 2016-01-05 MED ORDER — HYDROCORTISONE 2.5 % RE CREA: 1.0000 "application " | TOPICAL_CREAM | Freq: Two times a day (BID) | RECTAL | 0 refills | Status: DC

## 2016-01-05 MED ORDER — NORETHINDRONE 0.35 MG PO TABS: 1.0000 | ORAL_TABLET | Freq: Every day | ORAL | 11 refills | Status: DC

## 2016-01-05 NOTE — Progress Notes (Signed)
Subjective:     Bianca Hodge is a 35 y.o. female who presents for a postpartum visit. She is 8 weeks postpartum following a spontaneous vaginal delivery. I have fully reviewed the prenatal and intrapartum course. The delivery was at 39 gestational weeks. Outcome: spontaneous vaginal delivery. Anesthesia: epidural. Postpartum course has been unremarkable. Baby's course has been unremarkable. Baby is feeding by breast. Bleeding no bleeding. Bowel function is abnormal: constipation and hemorrhoids. Bladder function is normal. Patient is not sexually active. Contraception method is oral progesterone-only contraceptive. Postpartum depression screening: negative.  The following portions of the patient's history were reviewed and updated as appropriate: allergies, current medications, past family history, past medical history, past social history, past surgical history and problem list.  Review of Systems Pertinent items noted in HPI and remainder of comprehensive ROS otherwise negative.   Objective:    There were no vitals taken for this visit.  General:  alert, cooperative and appears stated age  Lungs: nromal effort  Heart:  regular rate and rhythm  Abdomen: soft, non-tender; bowel sounds normal; no masses,  no organomegaly   Vulva:  normal  Vagina: normal vagina  Cervix:  multiparous appearance and no cervical motion tenderness  Corpus: normal size, contour, position, consistency, mobility, non-tender  Adnexa:  normal adnexa        Assessment:    Normal postpartum exam. Pap smear not done at today's visit Hemorrhoids and mild constipation.   Plan:    1. Contraception: oral progesterone-only contraceptive 2. Pap due 2020 3. Anusol for hemorrhoids 4. Continue Fiber and stool softeners 5.. Follow up in: 1 year or as needed.

## 2016-02-17 ENCOUNTER — Other Ambulatory Visit: Payer: Self-pay | Admitting: Family Medicine

## 2016-02-17 DIAGNOSIS — K640 First degree hemorrhoids: Secondary | ICD-10-CM

## 2016-05-17 IMAGING — US US MFM FETAL NUCHAL TRANSLUCENCY
1 series · 13 of 26 positions shown · non-contrast
Comparison: none

[Series 1: us mfm fetal nuchal translucency · 0.20mm/px · 13 of 26 slices shown]
[im 2/26]
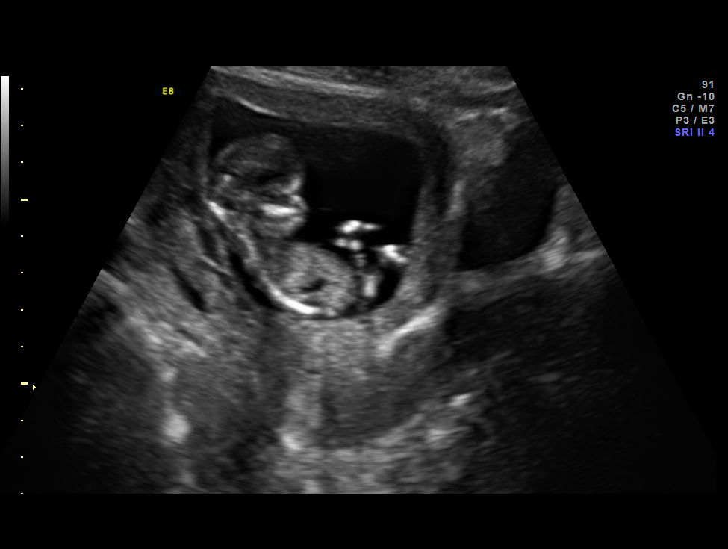
[im 4/26]
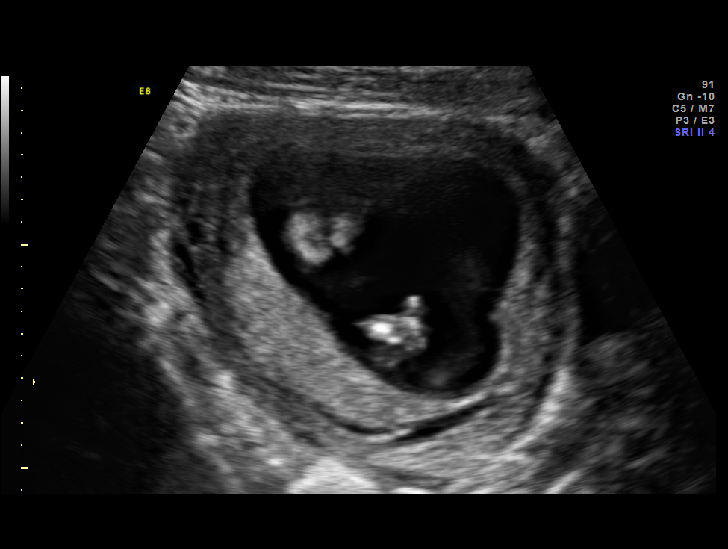
[im 6/26]
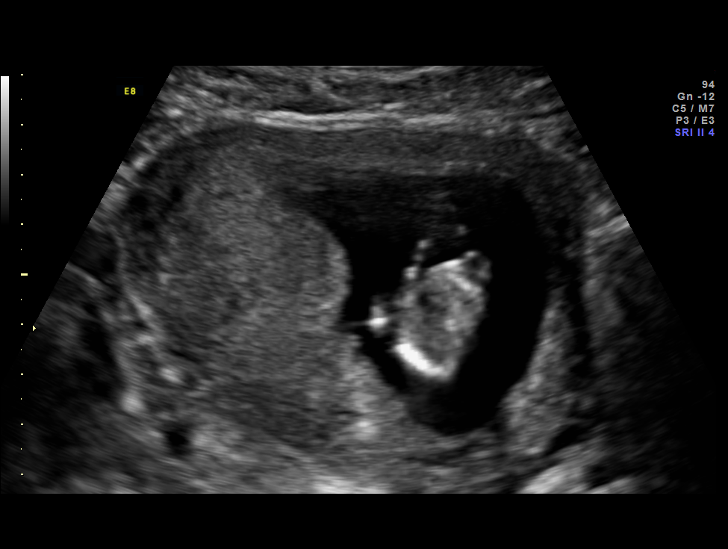
[im 8/26]
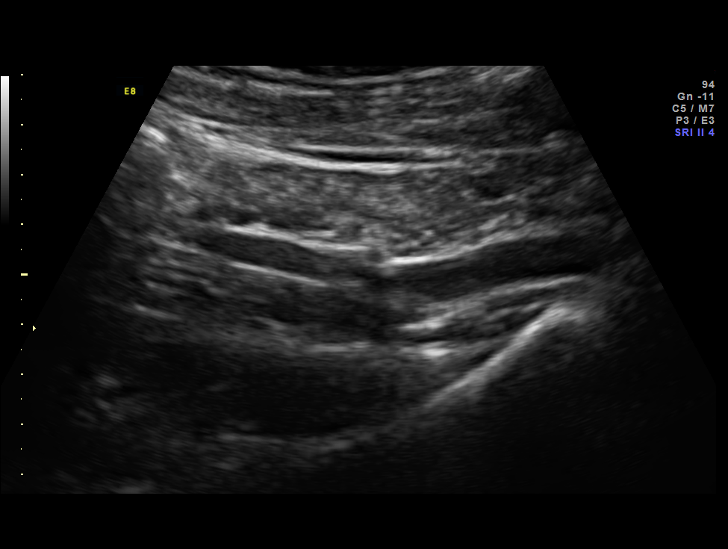
[im 10/26]
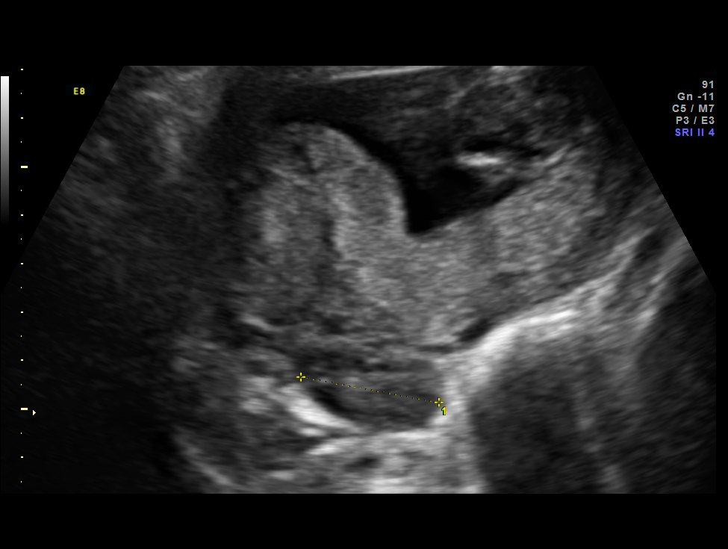
[im 12/26]
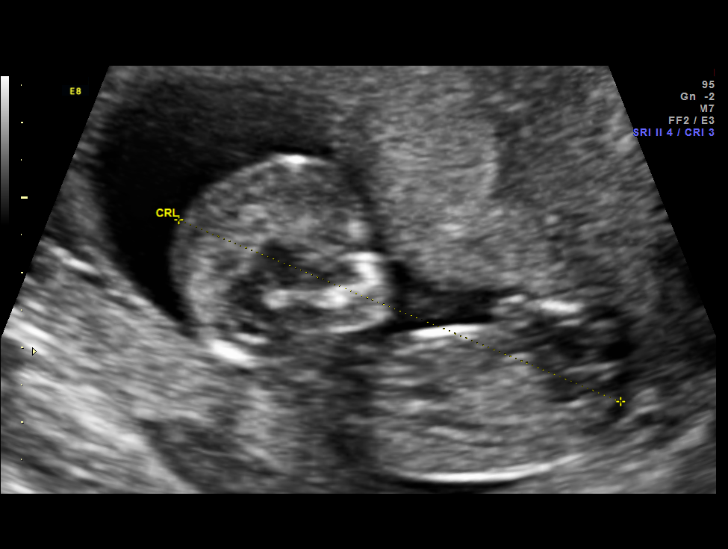
[im 14/26]
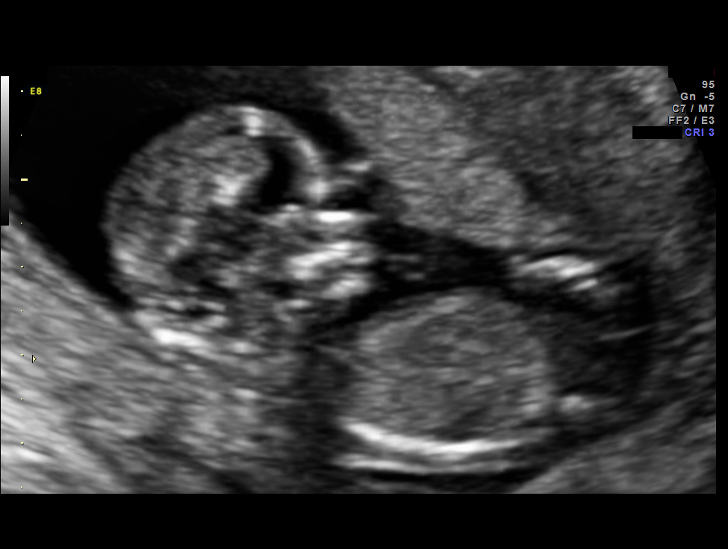
[im 16/26]
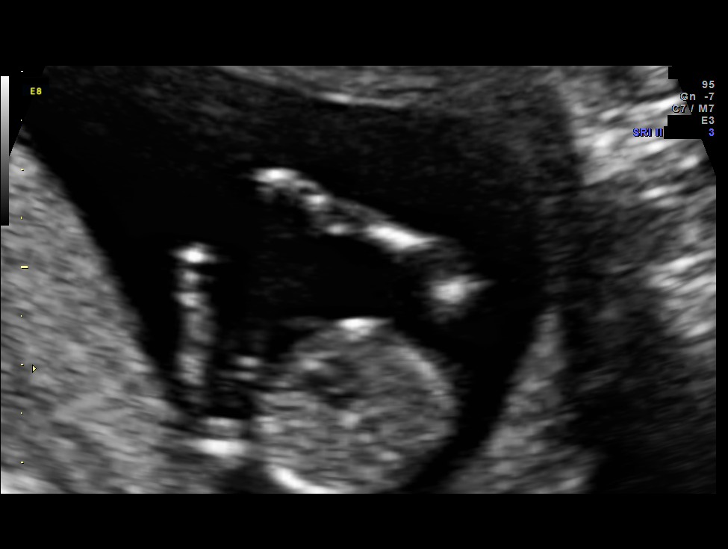
[im 18/26]
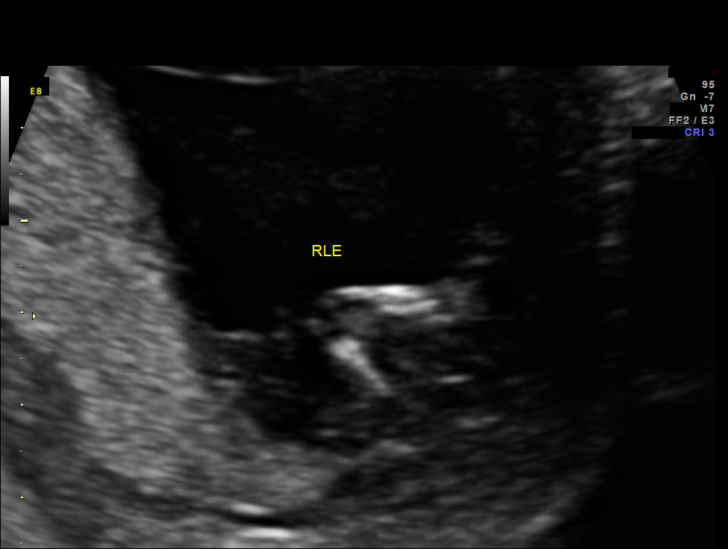
[im 20/26]
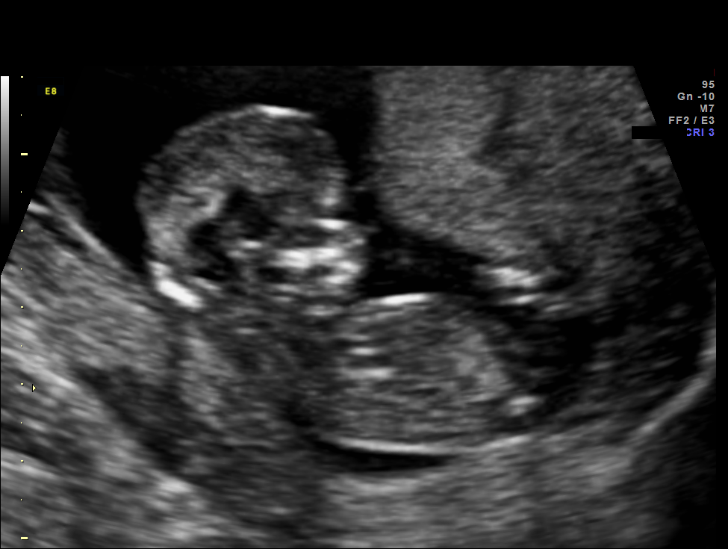
[im 22/26]
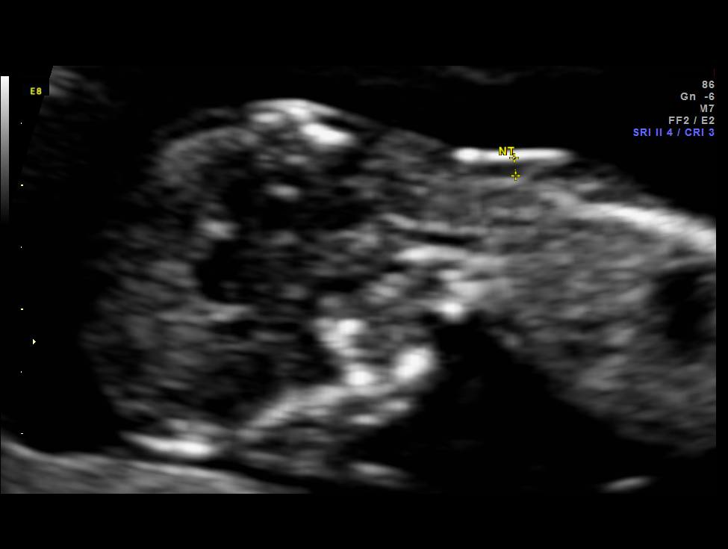
[im 24/26]
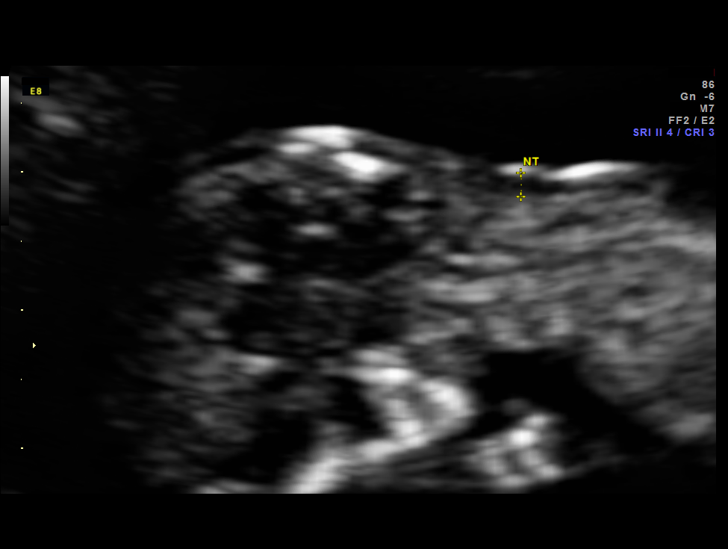
[im 26/26]
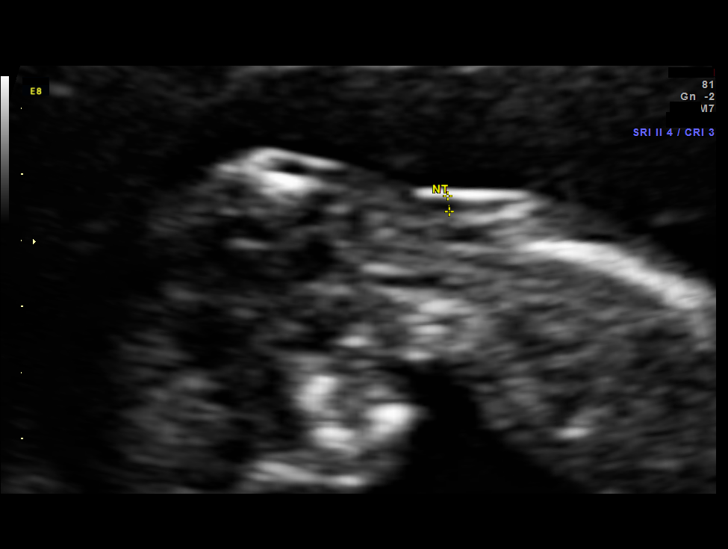

[13 of 26 positions shown; findings below may reference images not displayed]

[facility] Clinic
[REDACTED]-
Faculty Physician

TRANSLUCENCY

1  IDRESSA KEBAILI             [id number]       [id number]     [id number]
Indications

12 weeks gestation of pregnancy
First trimester aneuploidy screen (NT)         Z36
Medical complication of pregnancy (IBS)
OB History

Gravidity:    2         Term:   0        Prem:   0        SAB:   0
TOP:          1       Ectopic:  0        Living: 0
Fetal Evaluation

Num Of Fetuses:     1
Preg. Location:     Intrauterine
Fetal Heart         150
Rate(bpm):
Cardiac Activity:   Observed
Presentation:       Breech
Placenta:           Posterior Left

Amniotic Fluid
AFI FV:      Subjectively within normal limits
Gestational Age

LMP:           12w 0d        Date:  02/11/15                 EDD:   11/18/15
Best:          12w 3d     Det. By:  Early Ultrasound         EDD:   11/15/15
(04/04/15)
1st Trimester Genetic Sonogram Screening

CRL:            64.6  mm    G. Age:   12w 5d                 EDD:   11/13/15
Nuc Trans:       1.7  mm
Nasal Bone:                 Present
Anatomy

Cranium:          Appears normal         Upper             Visualized
Extremities:
Choroid Plexus:   Appears normal         Lower             Visualized
Extremities:
Stomach:          Appears normal, left
sided
Cervix Uterus Adnexa

Cervix
Normal appearance by transabdominal scan.

Left Ovary
Within normal limits.

Right Ovary
Within normal limits.

Adnexa:       No abnormality visualized.
Impression

SIUP at 20w3d
CRL is appropriate for dating
fetal morphology is gestational age appropriate
NT =1.7mm
Nasal bone present
Recommendations

Fetal survey in 6 weeks.

## 2019-01-14 ENCOUNTER — Other Ambulatory Visit (HOSPITAL_COMMUNITY)
Admission: RE | Admit: 2019-01-14 | Discharge: 2019-01-14 | Disposition: A | Discharge status: Home / Self Care | Payer: No Typology Code available for payment source | Source: Ambulatory Visit | Attending: Family Medicine | Admitting: Family Medicine

## 2019-01-14 ENCOUNTER — Other Ambulatory Visit: Payer: Self-pay | Admitting: Neurology

## 2019-01-14 DIAGNOSIS — Z01419 Encounter for gynecological examination (general) (routine) without abnormal findings: Secondary | ICD-10-CM | POA: Diagnosis not present

## 2019-01-15 LAB — CYTOLOGY - PAP
Diagnosis: NEGATIVE
HPV: NOT DETECTED

## 2019-01-26 ENCOUNTER — Encounter: Payer: Self-pay | Admitting: Physical Therapy

## 2019-01-26 ENCOUNTER — Ambulatory Visit: Payer: No Typology Code available for payment source | Attending: Family Medicine | Admitting: Physical Therapy

## 2019-01-26 ENCOUNTER — Other Ambulatory Visit: Payer: Self-pay

## 2019-01-26 DIAGNOSIS — N393 Stress incontinence (female) (male): Secondary | ICD-10-CM | POA: Insufficient documentation

## 2019-01-26 DIAGNOSIS — R278 Other lack of coordination: Secondary | ICD-10-CM | POA: Diagnosis present

## 2019-01-26 DIAGNOSIS — M6281 Muscle weakness (generalized): Secondary | ICD-10-CM | POA: Diagnosis not present

## 2019-01-26 NOTE — Patient Instructions (Signed)
Certain foods and liquids will decrease the pH making the urine more acidic.  Urinary urgency increases when the urine has a low pH.  Most common irritants: alcohol, carbonated beverages and caffinated beverages.  Foods to avoid: apple juice, apples, ascorbic acid, canteloupes, chili, citrus fruits, coffee, cranberries, grapes, guava, peaches, pepper, pineapple, plums, strawberries, tea, tomatoes, and vinegar.  Drinking plenty of water may help to increase the pH and dilute out any of the effects of specific irritants.  Foods that are NOT irritating to the bladder include: Pears, papayas, sun-brewed teas, watermelons, non-citrus herbal teas, apricots, kava and low-acid instant drinks (Postum)  [facility] [address], [address] [place], [place] [address] Phone # [phone] Fax [phone]  

## 2019-01-26 NOTE — Therapy (Signed)
[facility] [address], [address] [place], [place], [address] Phone: [phone]   Fax:  [phone]  Physical Therapy Treatment  Patient Details  Name: Bianca Hodge MRN: [id number] Date of Birth: 05/24/1981 Referring Provider (PT): Dr. Garth BignessKathryn Timberlake   Encounter Date: 01/26/2019  PT End of Session - 01/26/19 1426    Visit Number  1    Date for PT Re-Evaluation  04/20/19    PT Start Time  1340    PT Stop Time  1420    PT Time Calculation (min)  40 min    Activity Tolerance  Patient tolerated treatment well;No increased pain    Behavior During Therapy  WFL for tasks assessed/performed       Past Medical History:  Diagnosis Date  . Parasite infection 06-04-2014    Past Surgical History:  Procedure Laterality Date  . APPENDECTOMY      There were no vitals filed for this visit.  Subjective Assessment - 01/26/19 1349    Subjective  Patient reports her urinary leakage started after she gave birth on 11/10/2015. Started as a little bit of urine leakage. Then progressed with physical activities. The last 2-3 months urine leaks throughout the day and changes her underwear 2-3 times per day. Patient had a level 3 tear when she gave birth. Patient has painful clicking in her left hip. . Patient feels her right knee is painful.    Patient Stated Goals  reduce urinary leakage; reduce left hip pain    Currently in Pain?  Yes    Pain Score  2     Pain Location  Hip    Pain Orientation  Left    Pain Descriptors / Indicators  Discomfort    Pain Type  Chronic pain    Pain Onset  More than a month ago    Pain Frequency  Intermittent    Aggravating Factors   long walks on concrete surface, yoga moves, sleep in a cramped position    Pain Relieving Factors  good night sleep, resting, stretching         [facility] PT Assessment - 01/26/19 0001      Assessment   Medical Diagnosis  N39.3 Stress Incontinence    Referring Provider  (PT)  Dr. Garth BignessKathryn Timberlake    Onset Date/Surgical Date  10/25/18    Prior Therapy  none for pelvic floor      Precautions   Precautions  None      Restrictions   Weight Bearing Restrictions  No      Balance Screen   Has the patient fallen in the past 6 months  Yes    How many times?  3   when she is tired and fatiqued   Has the patient had a decrease in activity level because of a fear of falling?   No    Is the patient reluctant to leave their home because of a fear of falling?   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    Leisure  prior to COVID dance classes; swimming in the pool      Cognition   Overall Cognitive Status  Within Functional Limits for tasks assessed      Posture/Postural Control  Posture/Postural Control  No significant limitations      ROM / Strength   AROM / PROM / Strength  AROM;PROM;Strength      AROM   Lumbar Extension  decreased by 25%      PROM   Right Hip External Rotation   55    Left Hip External Rotation   55      Strength   Left Hip Flexion  5/5   pain   Left Hip Internal Rotation  5/5   pain   Left Hip ABduction  4/5      Palpation   SI assessment   left ilium rotated posteriorly    Palpation comment  tenderness posterior left greater trochanter, left iliopsoas, lateral left quadricep                Pelvic Floor Special Questions - 01/26/19 0001    Prior Pregnancies  Yes    Number of Pregnancies  1    Number of Vaginal Deliveries  1    Any difficulty with labor and deliveries  Yes   grade 3 tear   Currently Sexually Active  Yes    Is this Painful  No    Urinary Leakage  Yes    Activities that cause leaking  Walking;Coughing;Sneezing;Laughing;Lifting;Bending;Exercising;Running    Urinary frequency  every 2 hours    Fecal incontinence   No   pretty regular   Caffeine beverages  2 cups of coffee per day    Falling out feeling (prolapse)  No    Exam Type  Deferred   on menstrual cycle               PT Education - 01/26/19 1425    Education Details  how to fill out a bladder diary; what bladder irritants are and how they affect the bladder    Person(s) Educated  Patient    Methods  Explanation;Handout    Comprehension  Verbalized understanding       PT Short Term Goals - 01/26/19 1438      PT SHORT TERM GOAL #1   Title  independent with initial HEP    Time  4    Period  Weeks    Status  New    Target Date  02/23/19      PT SHORT TERM GOAL #2   Title  understand what bladder irritants are and how they affect the bladder    Time  4    Period  Weeks    Status  New    Target Date  02/23/19      PT SHORT TERM GOAL #3   Title  pelvis in correct alignment so her left hip pain decreased >/= 25%    Time  4    Period  Weeks    Status  New    Target Date  02/23/19      PT SHORT TERM GOAL #4   Title  finish assessment of the pelvic floor to deternime her strength    Time  4    Period  Weeks    Status  New    Target Date  02/23/19        PT Long Term Goals - 01/26/19 1441      PT LONG TERM GOAL #1   Title  independent with HEP and understand how to progress herself    Time  12    Period  Weeks    Status  New  Target Date  04/20/19      PT LONG TERM GOAL #2   Title  urinary leakage improved >/= 75% due to pelvic floor strength improved >/= 1 muscle grade    Time  12    Period  Weeks    Status  New    Target Date  04/20/19      PT LONG TERM GOAL #3   Title  left hip pain decreased >/= 75% when walking on conrete for >/= 45 minutes    Time  12    Period  Weeks    Status  New    Target Date  04/20/19      PT LONG TERM GOAL #4   Title  changes her underwear 0-1x due to urinary leakage decreased and she has reduced her caffiene intake    Time  12    Period  Weeks    Status  New     Target Date  04/20/19            Plan - 01/26/19 1427    Clinical Impression Statement  Patient is a 38 year old female with stress incontinence for 3 years but the last 3 months it is worse. Patient reports she will leak urine non-stop all day and has to change her underwear 3 times per day. Patient does not wear a pad due to getting an infection from the pad irritating her perineal area. Physical therapist was unable to assess pelvic floor strength due to her being on her cycle and will be assessed next visit. Patient reports left hip pain with long walks on concrete, sleeping in a cramped position. Pain level is 2/10 in left hip. Left hip strength is 4/5. Left ilium rotated posteriorly. Palpable tenderness located on left iliopsoas, and around left hip. Patient will benefit from skilled therapy to improve urinary leakage and reduce left hip pain.    Personal Factors and Comorbidities  Fitness    Examination-Activity Limitations  Bed Mobility;Continence;Toileting;Locomotion Level    Stability/Clinical Decision Making  Evolving/Moderate complexity    Clinical Decision Making  Low    Rehab Potential  Excellent    PT Frequency  1x / week    PT Duration  12 weeks    PT Treatment/Interventions  Biofeedback;Electrical Stimulation;Cryotherapy;Moist Heat;Iontophoresis 4mg /ml Dexamethasone;Ultrasound;Therapeutic exercise;Therapeutic activities;Neuromuscular re-education;Patient/family education;Dry needling;Passive range of motion;Manual techniques;Taping;Joint Manipulations    PT Next Visit Plan  finish assessing the pelvic floor, corect left ilium, dry needling around left hip; pelvic floor strengthening, look over bladder diary    Consulted and Agree with Plan of Care  Patient       Patient will benefit from skilled therapeutic intervention in order to improve the following deficits and impairments:  Decreased coordination, Increased fascial restricitons, Decreased endurance, Pain, Decreased  strength  Visit Diagnosis: Muscle weakness (generalized) - Plan: PT plan of care cert/re-cert  Other lack of coordination - Plan: PT plan of care cert/re-cert  Stress incontinence (female) (female) - Plan: PT plan of care cert/re-cert     Problem List Patient Active Problem List   Diagnosis Date Noted  . Irritable bowel syndrome (IBS) 04/04/2015   Earlie Counts, PT 01/26/19 2:47 PM     [facility] [address], [place], [place], [address] Phone: [phone]   Fax:  [phone]  Name: Bianca Hodge MRN: [id number] Date of Birth: 1980/08/01

## 2019-02-11 ENCOUNTER — Other Ambulatory Visit: Payer: Self-pay

## 2019-02-11 ENCOUNTER — Ambulatory Visit: Payer: No Typology Code available for payment source | Attending: Family Medicine | Admitting: Physical Therapy

## 2019-02-11 ENCOUNTER — Encounter: Payer: Self-pay | Admitting: Physical Therapy

## 2019-02-11 DIAGNOSIS — M6281 Muscle weakness (generalized): Secondary | ICD-10-CM

## 2019-02-11 DIAGNOSIS — R278 Other lack of coordination: Secondary | ICD-10-CM | POA: Diagnosis present

## 2019-02-11 DIAGNOSIS — N393 Stress incontinence (female) (male): Secondary | ICD-10-CM | POA: Diagnosis present

## 2019-02-11 NOTE — Therapy (Signed)
[facility] [address] [place], [place], [address] Phone: [phone]   Fax:  [phone]  Physical Therapy Treatment  Patient Details  Name: Bianca Hodge MRN: [id number] Date of Birth: [birth year] Referring Provider (PT): Dr. Garth Bigness   Encounter Date: 02/11/2019  PT End of Session - 02/11/19 1244    Visit Number  2    Date for PT Re-Evaluation  04/20/19    Authorization Type  UHC    Authorization - Visit Number  2    Authorization - Number of Visits  30    PT Start Time  1209   came late   PT Stop Time  1240    PT Time Calculation (min)  31 min    Activity Tolerance  Patient tolerated treatment well;No increased pain    Behavior During Therapy  WFL for tasks assessed/performed       Past Medical History:  Diagnosis Date  . Parasite infection 06-04-2014    Past Surgical History:  Procedure Laterality Date  . APPENDECTOMY      There were no vitals filed for this visit.  Subjective Assessment - 02/11/19 1209    Subjective  I will leak after 2 cups of coffee. I have less leakage. When I sneeze and coughing  I leakage. If I contract the pelvic floor I can prevent leakage sometimes. I will leak more at the end of the day.My left hip started during a spin class.    Patient Stated Goals  reduce urinary leakage; reduce left hip pain    Currently in Pain?  Yes    Pain Score  3     Pain Location  Hip    Pain Orientation  Left    Pain Descriptors / Indicators  Discomfort    Pain Type  Chronic pain    Pain Onset  More than a month ago    Pain Frequency  Intermittent    Aggravating Factors   long walks on concrete surface, yoga moves, sleep on left side or cramped position    Pain Relieving Factors  good night sleep, resting, stretching    Multiple Pain Sites  No                    Pelvic Floor Special Questions - 02/11/19 0001    Pelvic Floor Internal Exam  Patient confirms  identificaton and approves PT to assess pelvic floor and treatment    Exam Type  Vaginal    Strength  fair squeeze, definite lift    Strength # of seconds  10        [facility] Adult PT Treatment/Exercise - 02/11/19 0001      Self-Care   Self-Care  Other Self-Care Comments    Other Self-Care Comments   discussed bladder diary and coffe being her irritant and leakage more 5-11 PM      Neuro Re-ed    Neuro Re-ed Details   pelvic floor contraction in supine and sitting with quick flicks      Lumbar Exercises: Stretches   Piriformis Stretch  Right;Left;1 rep;30 seconds   sitting     Manual Therapy   Manual Therapy  Internal Pelvic Floor;Soft tissue mobilization    Soft tissue mobilization  left psoas    Internal Pelvic Floor  left pelvic floor muscles with left hip movement             PT Education - 02/11/19 1239    Education Details  Access Code: [email or web address]; bladder irritants with coffee being her main trigger    Person(s) Educated  Patient    Methods  Explanation;Demonstration;Handout;Verbal cues    Comprehension  Verbalized understanding;Returned demonstration       PT Short Term Goals - 01/26/19 1438      PT SHORT TERM GOAL #1   Title  independent with initial HEP    Time  4    Period  Weeks    Status  New    Target Date  02/23/19      PT SHORT TERM GOAL #2   Title  understand what bladder irritants are and how they affect the bladder    Time  4    Period  Weeks    Status  New    Target Date  02/23/19      PT SHORT TERM GOAL #3   Title  pelvis in correct alignment so her left hip pain decreased >/= 25%    Time  4    Period  Weeks    Status  New    Target Date  02/23/19      PT SHORT TERM GOAL #4   Title  finish assessment of the pelvic floor to deternime her strength    Time  4    Period  Weeks    Status  New    Target Date  02/23/19        PT Long Term Goals - 01/26/19 1441      PT LONG TERM GOAL #1   Title  independent with HEP and understand  how to progress herself    Time  12    Period  Weeks    Status  New    Target Date  04/20/19      PT LONG TERM GOAL #2   Title  urinary leakage improved >/= 75% due to pelvic floor strength improved >/= 1 muscle grade    Time  12    Period  Weeks    Status  New    Target Date  04/20/19      PT LONG TERM GOAL #3   Title  left hip pain decreased >/= 75% when walking on conrete for >/= 45 minutes    Time  12    Period  Weeks    Status  New    Target Date  04/20/19      PT LONG TERM GOAL #4   Title  changes her underwear 0-1x due to urinary leakage decreased and she has reduced her caffiene intake    Time  12    Period  Weeks    Status  New    Target Date  04/20/19            Plan - 02/11/19 1214    Clinical Impression Statement  Patient pelvis is in correct alignment after manual work. Patient pelvic floor strength is 3/5 holding for 10 seconds. Patient has more trigger points on the left pelvic floor muscles. Patient will leak urine with coughing and sneezing especially in the evening.    Personal Factors and Comorbidities  Fitness    Examination-Activity Limitations  Bed Mobility;Continence;Toileting;Locomotion Level    Stability/Clinical Decision Making  Evolving/Moderate complexity    Rehab Potential  Excellent    PT Frequency  1x / week    PT Duration  12 weeks    PT Treatment/Interventions  Biofeedback;Electrical Stimulation;Cryotherapy;Moist Heat;Iontophoresis 4mg /ml Dexamethasone;Ultrasound;Therapeutic exercise;Therapeutic activities;Neuromuscular re-education;Patient/family education;Dry needling;Passive range of motion;Manual techniques;Taping;Joint Manipulations  PT Next Visit Plan  correct left ilium, dry needling around left hip; pelvic floor strengthening,    PT Home Exercise Plan  Access Code: [email or web address]    Recommended Other Services  MD signed the initial eval    Consulted and Agree with Plan of Care  Patient       Patient will benefit from skilled  therapeutic intervention in order to improve the following deficits and impairments:  Decreased coordination, Increased fascial restricitons, Decreased endurance, Pain, Decreased strength  Visit Diagnosis: Muscle weakness (generalized)  Other lack of coordination  Stress incontinence (female) (female)     Problem List Patient Active Problem List   Diagnosis Date Noted  . Irritable bowel syndrome (IBS) 04/04/2015    Earlie Counts, PT 02/11/19 12:52 PM   [facility] [address] [place], [place], [address] Phone: [phone]   Fax:  [phone]  Name: Joaquina Nissen MRN: [id number] Date of Birth: [birth year]

## 2019-02-11 NOTE — Patient Instructions (Signed)
Access Code: [email or web address]  URL: [email or web address]  Date: 02/11/2019  Prepared by: Earlie Counts   Exercises Supine Pelvic Floor Contract and Release - 5 reps - 1 sets - 10 se hold - 2x daily - 7x weekly Seated Pelvic Floor Contraction - 5 reps - 1 sets - 5 se hold - 3x daily - 7x weekly Seated Piriformis Stretch with Trunk Bend - 2 reps - 1 sets - 30 sec hold - 1x daily - 7x weekly [facility] [address] [place], [place] [address] Phone # [phone] Fax [phone]

## 2019-02-25 ENCOUNTER — Ambulatory Visit: Payer: No Typology Code available for payment source | Admitting: Physical Therapy

## 2019-03-02 ENCOUNTER — Ambulatory Visit: Payer: No Typology Code available for payment source | Attending: Family Medicine | Admitting: Physical Therapy

## 2019-03-02 ENCOUNTER — Other Ambulatory Visit: Payer: Self-pay

## 2019-03-02 DIAGNOSIS — N393 Stress incontinence (female) (male): Secondary | ICD-10-CM | POA: Insufficient documentation

## 2019-03-02 DIAGNOSIS — M6281 Muscle weakness (generalized): Secondary | ICD-10-CM | POA: Insufficient documentation

## 2019-03-02 DIAGNOSIS — R278 Other lack of coordination: Secondary | ICD-10-CM | POA: Diagnosis present

## 2019-03-02 NOTE — Patient Instructions (Addendum)
Cough: Phase 2 (Supine)    Lie flat. Squeeze pelvic floor and hold. Inhale. Lift head and shoulders. Exhale. Relax. Repeat _5__ times. Do _1-2__ times a day.   Copyright  VHI. All rights reserved.  [facility] [address] [place], [place] [address] Phone # [phone] Fax [phone]  Access Code: [email or web address]  URL: [email or web address]  Date: 03/02/2019  Prepared by: Earlie Counts   Exercises Supine Pelvic Floor Contract and Release - 5 reps - 1 sets - 15 se hold - 2x daily - 7x weekly Seated Pelvic Floor Contraction - 5 reps - 1 sets - 10 se hold - 3x daily - 7x weekly Seated Piriformis Stretch with Trunk Bend - 2 reps - 1 sets - 30 sec hold - 1x daily - 7x weekly Sidelying Hip Abduction - 20 reps - 1 sets - 1x daily - 7x weekly Clamshell with Resistance - 10 reps - 3 sets - 1x daily - 7x weekly Patient Education Engineer, manufacturing systems Trigger Point Dry Needling

## 2019-03-02 NOTE — Therapy (Signed)
[facility] [address], [address] [place], [place], [address] Phone: [phone]   Fax:  [phone]  Physical Therapy Treatment  Patient Details  Name: Bianca Hodge MRN: [id number] Date of Birth: 04/14/1981 Referring Provider (PT): Dr. Garth BignessKathryn Timberlake   Encounter Date: 03/02/2019  PT End of Session - 03/02/19 1622    Visit Number  3    Date for PT Re-Evaluation  04/20/19    Authorization Type  UHC    Authorization - Visit Number  3    Authorization - Number of Visits  30    PT Start Time  1622    PT Stop Time  1700   came late   PT Time Calculation (min)  38 min    Activity Tolerance  Patient tolerated treatment well;No increased pain    Behavior During Therapy  WFL for tasks assessed/performed       Past Medical History:  Diagnosis Date  . Parasite infection 06-04-2014    Past Surgical History:  Procedure Laterality Date  . APPENDECTOMY      There were no vitals filed for this visit.  Subjective Assessment - 03/02/19 1623    Subjective  I have less leakage. I have 50% less leakage. End of the day I have more leakage or drink too much coffee or eat anything that is acidic. My left hip is feeling better after I had my cycle.    Patient Stated Goals  reduce urinary leakage; reduce left hip pain    Currently in Pain?  No/denies                       [facility] Adult [facility] Treatment/Exercise - 03/02/19 0001      Therapeutic Activites    Therapeutic Activities  Other Therapeutic Activities    Other Therapeutic Activities  office chair posture; sitting at desk, changing positions      Exercises   Exercises  Other Exercises    Other Exercises   updated HEP that is on Medbridge look at HEP instructions      Lumbar Exercises: Supine   Other Supine Lumbar Exercises  supine contract the pelvic floor, breath in then lift head as you breath out      Lumbar Exercises: Sidelying   Clam  Left;15 reps;1  second   yellow band   Hip Abduction  Left;15 reps;1 second   with pelvic floor contraction     Manual Therapy   Manual Therapy  Soft tissue mobilization;Muscle Energy Technique    Soft tissue mobilization  left quadratus, left gluteal, left piriformis, and around greater trochanter, left diaphgram and iliopsoas    Muscle Energy Technique  correct left ilium rotated posteriorly       Trigger Point Dry Needling - 03/02/19 0001    Consent Given?  Yes    Education Handout Provided  Yes    Muscles Treated Back/Hip  Piriformis;Gluteus maximus    Gluteus Maximus Response  Twitch response elicited;Palpable increased muscle length    Piriformis Response  Twitch response elicited;Palpable increased muscle length           PT Education - 03/02/19 1702    Education Details  Access Code: [email or web address]; pelvic floor contraction in supine with breathing out    Person(s) Educated  Patient    Methods  Explanation;Demonstration;Verbal cues;Handout    Comprehension  Verbalized understanding;Returned demonstration       PT Short Term Goals - 03/02/19 1711  PT SHORT TERM GOAL #1   Title  independent with initial HEP    Time  4    Period  Weeks    Status  Achieved    Target Date  02/23/19      PT SHORT TERM GOAL #2   Title  understand what bladder irritants are and how they affect the bladder    Time  4    Period  Weeks    Status  Achieved    Target Date  02/23/19      PT SHORT TERM GOAL #3   Title  pelvis in correct alignment so her left hip pain decreased >/= 25%    Time  4    Period  Weeks    Status  Achieved      PT SHORT TERM GOAL #4   Title  finish assessment of the pelvic floor to deternime her strength    Time  4    Period  Weeks    Status  Achieved    Target Date  02/23/19        PT Long Term Goals - 01/26/19 1441      PT LONG TERM GOAL #1   Title  independent with HEP and understand how to progress herself    Time  12    Period  Weeks    Status  New     Target Date  04/20/19      PT LONG TERM GOAL #2   Title  urinary leakage improved >/= 75% due to pelvic floor strength improved >/= 1 muscle grade    Time  12    Period  Weeks    Status  New    Target Date  04/20/19      PT LONG TERM GOAL #3   Title  left hip pain decreased >/= 75% when walking on conrete for >/= 45 minutes    Time  12    Period  Weeks    Status  New    Target Date  04/20/19      PT LONG TERM GOAL #4   Title  changes her underwear 0-1x due to urinary leakage decreased and she has reduced her caffiene intake    Time  12    Period  Weeks    Status  New    Target Date  04/20/19            Plan - 03/02/19 1626    Clinical Impression Statement  Patient pelvis is in correct alignment after therapy. Patient has trigger points in the left gluteal and quadratus lumborum. Patient urinary leakage is 50% better. Patient was educated on correct body mechanics at her work station to reduce pressure on the pelvic floor. Patient will benefit from skilled therapy to reduce urinary leakage with strengthening and manual work.    Personal Factors and Comorbidities  Fitness    Examination-Activity Limitations  Bed Mobility;Continence;Toileting;Locomotion Level    Stability/Clinical Decision Making  Evolving/Moderate complexity    Rehab Potential  Excellent    PT Frequency  1x / week    PT Duration  12 weeks    PT Treatment/Interventions  Biofeedback;Electrical Stimulation;Cryotherapy;Moist Heat;Iontophoresis 4mg /ml Dexamethasone;Ultrasound;Therapeutic exercise;Therapeutic activities;Neuromuscular re-education;Patient/family education;Dry needling;Passive range of motion;Manual techniques;Taping;Joint Manipulations    PT Next Visit Plan  correct left ilium, dry needling around left hip; pelvic floor strengthening with advanced cough in supine; dry needle the iliopsoas and use ball to massage iliopsoas    PT Home Exercise Plan  Access Code: [email or web address]    Consulted and Agree with Plan  of Care  Patient       Patient will benefit from skilled therapeutic intervention in order to improve the following deficits and impairments:  Decreased coordination, Increased fascial restricitons, Decreased endurance, Pain, Decreased strength  Visit Diagnosis: Muscle weakness (generalized)  Other lack of coordination  Stress incontinence (female) (female)     Problem List Patient Active Problem List   Diagnosis Date Noted  . Irritable bowel syndrome (IBS) 04/04/2015    Eulis Foster, PT 03/02/19 5:13 PM   [facility] [address], [address] [place], [place], [address] Phone: [phone]   Fax:  [phone]  Name: Bianca Hodge MRN: [id number] Date of Birth: 23-Jan-1981

## 2019-03-11 ENCOUNTER — Encounter: Payer: No Typology Code available for payment source | Admitting: Physical Therapy

## 2019-03-13 ENCOUNTER — Other Ambulatory Visit: Payer: Self-pay

## 2019-03-13 DIAGNOSIS — Z20822 Contact with and (suspected) exposure to covid-19: Secondary | ICD-10-CM

## 2019-03-15 LAB — NOVEL CORONAVIRUS, NAA: SARS-CoV-2, NAA: NOT DETECTED

## 2019-03-16 ENCOUNTER — Encounter: Payer: No Typology Code available for payment source | Admitting: Physical Therapy

## 2019-03-17 ENCOUNTER — Other Ambulatory Visit: Payer: Self-pay

## 2019-03-17 DIAGNOSIS — Z20822 Contact with and (suspected) exposure to covid-19: Secondary | ICD-10-CM

## 2019-03-18 LAB — NOVEL CORONAVIRUS, NAA: SARS-CoV-2, NAA: NOT DETECTED

## 2019-03-25 ENCOUNTER — Encounter: Payer: No Typology Code available for payment source | Admitting: Physical Therapy

## 2019-04-15 ENCOUNTER — Other Ambulatory Visit: Payer: Self-pay

## 2019-04-15 ENCOUNTER — Ambulatory Visit: Payer: No Typology Code available for payment source | Attending: Family Medicine | Admitting: Physical Therapy

## 2019-04-15 ENCOUNTER — Encounter: Payer: Self-pay | Admitting: Physical Therapy

## 2019-04-15 DIAGNOSIS — N393 Stress incontinence (female) (male): Secondary | ICD-10-CM | POA: Diagnosis present

## 2019-04-15 DIAGNOSIS — M6281 Muscle weakness (generalized): Secondary | ICD-10-CM | POA: Insufficient documentation

## 2019-04-15 DIAGNOSIS — R278 Other lack of coordination: Secondary | ICD-10-CM

## 2019-04-15 NOTE — Therapy (Signed)
[facility] [address] [place], [place], [address] Phone: [phone]   Fax:  [phone]  Physical Therapy Treatment  Patient Details  Name: Bianca Hodge MRN: [id number] Date of Birth: [birth year] Referring Provider (PT): Dr. Sela Hilding   Encounter Date: 04/15/2019  PT End of Session - 04/15/19 1015    Visit Number  4    Date for PT Re-Evaluation  04/20/19    Authorization Type  [employer]    Authorization - Visit Number  4    Authorization - Number of Visits  30    PT Start Time  0845    PT Stop Time  0925    PT Time Calculation (min)  40 min    Activity Tolerance  Patient tolerated treatment well;No increased pain    Behavior During Therapy  WFL for tasks assessed/performed       Past Medical History:  Diagnosis Date  . Parasite infection 06-04-2014    Past Surgical History:  Procedure Laterality Date  . APPENDECTOMY      There were no vitals filed for this visit.  Subjective Assessment - 04/15/19 0849    Subjective  Urinary leakage is 80% better. The left hip pain is 70% better. I do not feel the pain is in the left hip instead is in the muscles. I started to cycle and is helping my hip. Carbonated beverages are my trigger for the bladder.    Patient Stated Goals  reduce urinary leakage; reduce left hip pain    Currently in Pain?  Yes    Pain Score  3     Pain Location  Hip    Pain Orientation  Left    Pain Descriptors / Indicators  Spasm    Pain Type  Chronic pain    Pain Onset  More than a month ago    Pain Frequency  Intermittent    Aggravating Factors   long walks on concrete surface, yoga moves, sleep on left side or cramped position    Pain Relieving Factors  good night sleep, resting, stretching         [facility] PT Assessment - 04/15/19 0001      Assessment   Medical Diagnosis  N39.3 Stress Incontinence    Referring Provider (PT)  Dr. 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    Leisure  prior to [place]; swimming in the pool      Cognition   Overall Cognitive Status  Within Functional Limits for tasks assessed      Posture/Postural Control   Posture/Postural Control  No significant limitations      ROM / Strength   AROM / PROM / Strength  AROM;PROM;Strength      AROM   Lumbar Extension  full      Strength   Left Hip Flexion  5/5    Left Hip Internal Rotation  5/5    Left Hip ABduction  4+/5  Palpation   SI assessment   left ilium rotated posteriorly    Palpation comment  tenderness located in the left iliopsoas, left piriformis, left lateral hamstring                Pelvic Floor Special Questions - 04/15/19 0001    Urinary Leakage  Yes   only if not prepared or have something acidic   Exam Type  Deferred    Strength  good squeeze, good lift, able to hold agaisnt strong resistance   patient checks her strength and feels the vaginal pull up       [facility] Adult PT Treatment/Exercise - 04/15/19 0001      Exercises   Exercises  Other Exercises    Other Exercises   verbally reviewed HEP and added hamstring, ITB, hip adductor and piriformis stretch with band      Manual Therapy   Manual Therapy  Soft tissue mobilization;Joint mobilization    Joint Mobilization  PA and rotational mobilization of L1-L5 and side glide of the L1-L5 in prone    Soft tissue mobilization  using assistive addaday for the right gluteal, piriformis, and hamstring, and ITB banc on left       Trigger Point Dry Needling - 04/15/19 0001    Consent Given?  Yes    Education Handout  Provided  Previously provided    Muscles Treated Lower Quadrant  Hamstring    Muscles Treated Back/Hip  Gluteus maximus;Piriformis    Hamstring Response  Twitch response elicited;Palpable increased muscle length   left   Gluteus Maximus Response  Twitch response elicited;Palpable increased muscle length   left   Piriformis Response  Twitch response elicited;Palpable increased muscle length   left          PT Education - 04/15/19 1015    Education Details  Access Code: [email or web address]    Person(s) Educated  Patient    Methods  Explanation;Demonstration;Verbal cues;Handout    Comprehension  Verbalized understanding;Returned demonstration       PT Short Term Goals - 03/02/19 1711      PT SHORT TERM GOAL #1   Title  independent with initial HEP    Time  4    Period  Weeks    Status  Achieved    Target Date  02/23/19      PT SHORT TERM GOAL #2   Title  understand what bladder irritants are and how they affect the bladder    Time  4    Period  Weeks    Status  Achieved    Target Date  02/23/19      PT SHORT TERM GOAL #3   Title  pelvis in correct alignment so her left hip pain decreased >/= 25%    Time  4    Period  Weeks    Status  Achieved      PT SHORT TERM GOAL #4   Title  finish assessment of the pelvic floor to deternime her strength    Time  4    Period  Weeks    Status  Achieved    Target Date  02/23/19        PT Long Term Goals - 04/15/19 0855      PT LONG TERM GOAL #1   Title  independent with HEP and understand how to progress herself    Time  12    Period  Weeks    Status  Achieved  PT LONG TERM GOAL #2   Title  urinary leakage improved >/= 75% due to pelvic floor strength improved >/= 1 muscle grade    Time  12    Period  Weeks    Status  Achieved      PT LONG TERM GOAL #3   Title  left hip pain decreased >/= 75% when walking on conrete for >/= 45 minutes    Time  12    Period  Weeks    Status  Achieved      PT LONG TERM GOAL #4   Title   changes her underwear 0-1x due to urinary leakage decreased and she has reduced her caffiene intake    Time  12    Period  Weeks    Status  Achieved            Plan - 04/15/19 0847    Clinical Impression Statement  Patient reports her urinary leakage is 80% better and she will leak a small drip if she does not contract her pelvic floor prior to activity. Patient pelvis went into alignment after manual work on the muscles. Patient is independent with her HEP. Patient is able to feel her pelvic floor contract and lift when she does it at her home.    Personal Factors and Comorbidities  Fitness    Examination-Activity Limitations  Bed Mobility;Continence;Toileting;Locomotion Level    Stability/Clinical Decision Making  Evolving/Moderate complexity    Rehab Potential  Excellent    PT Frequency  --    PT Duration  --    PT Treatment/Interventions  Biofeedback;Electrical Stimulation;Cryotherapy;Moist Heat;Iontophoresis 48m/ml Dexamethasone;Ultrasound;Therapeutic exercise;Therapeutic activities;Neuromuscular re-education;Patient/family education;Dry needling;Passive range of motion;Manual techniques;Taping;Joint Manipulations    PT Next Visit Plan  Discharge to HEP    PT Home Exercise Plan  Access Code: [email or web address]   Consulted and Agree with Plan of Care  Patient       Patient will benefit from skilled therapeutic intervention in order to improve the following deficits and impairments:  Decreased coordination, Increased fascial restricitons, Decreased endurance, Pain, Decreased strength  Visit Diagnosis: Muscle weakness (generalized)  Other lack of coordination  Stress incontinence (female) (female)     Problem List Patient Active Problem List   Diagnosis Date Noted  . Irritable bowel syndrome (IBS) 04/04/2015    CEarlie Counts PT 04/15/19 11:24 AM   [facility] [address] [place] [id number]:  [phone]  Fax:  [phone] Name: AKamille ToomeyMRN: [id number] of Birth: 127-Feb-1982PHYSICAL THERAPY DISCHARGE SUMMARY  Visits from Start of Care: 4  Current functional level related to goals / functional outcomes: See above.   Remaining deficits: See above.   Education / Equipment: HEP Plan: Patient agrees to discharge.  Patient goals were met. Patient is being discharged due to meeting the stated rehab goals.  Thank you for the referral. CEarlie Counts PT 04/15/19 11:25 AM  ?????

## 2019-04-15 NOTE — Patient Instructions (Signed)
Access Code: [email or web address]  URL: [email or web address]  Date: 04/15/2019  Prepared by: Earlie Counts   Exercises Supine Pelvic Floor Contract and Release - 5 reps - 1 sets - 15 se hold - 2x daily - 7x weekly Seated Pelvic Floor Contraction - 5 reps - 1 sets - 10 se hold - 3x daily - 7x weekly Seated Piriformis Stretch with Trunk Bend - 2 reps - 1 sets - 30 sec hold - 1x daily - 7x weekly Sidelying Hip Abduction - 20 reps - 1 sets - 1x daily - 7x weekly Clamshell with Resistance - 10 reps - 3 sets - 1x daily - 7x weekly Supine Hamstring Stretch with Strap - 2 reps - 1 sets - 30 sec hold - 1x daily - 7x weekly Hip Adductors and Hamstring Stretch with Strap - 2 reps - 1 sets - 30 sec hold - 1x daily - 7x weekly Supine ITB Stretch with Strap - 2 reps - 1 sets - 30 sec hold - 1x daily - 7x weekly Patient Education Office Posture Posture and Body Mechanics Trigger Point Dry Needling .[facility] [address] [place], [place] [address] Phone # [phone] Fax [phone]

## 2020-04-05 ENCOUNTER — Ambulatory Visit (INDEPENDENT_AMBULATORY_CARE_PROVIDER_SITE_OTHER): Payer: No Typology Code available for payment source | Admitting: General Practice

## 2020-04-05 ENCOUNTER — Other Ambulatory Visit: Payer: Self-pay

## 2020-04-05 ENCOUNTER — Encounter: Payer: Self-pay | Admitting: General Practice

## 2020-04-05 DIAGNOSIS — Z3201 Encounter for pregnancy test, result positive: Secondary | ICD-10-CM

## 2020-04-05 LAB — POCT PREGNANCY, URINE: Preg Test, Ur: POSITIVE — AB

## 2020-04-05 NOTE — Progress Notes (Signed)
Patient dropped off urine sample for pregnancy test at office this morning. UPT +.  Called patient at home & informed her + UPT. Patient reports first positive home test last week. LMP around 02/26/20 EDD 12/02/20 [redacted]w[redacted]d. Patient reports taking PNV daily but no other meds/vitamins. Low risk last pregnancy & no significant medical hx. She asked if it is safe to use Neutrogena T-Gel shampoo because it has tar in it. Told patient that is safe to use. Also discussed front office will contact her with initial prenatal visit appt. Patient verbalized understanding.  Chase Caller RN BSN 04/05/20

## 2020-04-05 NOTE — Addendum Note (Signed)
Addended by: Kathee Delton on: 04/05/2020 01:29 PM   Modules accepted: Level of Service

## 2020-05-25 ENCOUNTER — Other Ambulatory Visit: Payer: Self-pay

## 2020-05-25 ENCOUNTER — Ambulatory Visit (INDEPENDENT_AMBULATORY_CARE_PROVIDER_SITE_OTHER): Payer: No Typology Code available for payment source | Admitting: Medical

## 2020-05-25 ENCOUNTER — Encounter: Payer: Self-pay | Admitting: Medical

## 2020-05-25 VITALS — BP 123/64 | HR 72 | Wt 191.5 lb

## 2020-05-25 DIAGNOSIS — Z3A12 12 weeks gestation of pregnancy: Secondary | ICD-10-CM

## 2020-05-25 DIAGNOSIS — Z3480 Encounter for supervision of other normal pregnancy, unspecified trimester: Secondary | ICD-10-CM

## 2020-05-25 DIAGNOSIS — O0990 Supervision of high risk pregnancy, unspecified, unspecified trimester: Secondary | ICD-10-CM | POA: Insufficient documentation

## 2020-05-25 DIAGNOSIS — O09521 Supervision of elderly multigravida, first trimester: Secondary | ICD-10-CM

## 2020-05-25 HISTORY — DX: Supervision of high risk pregnancy, unspecified, unspecified trimester: O09.90

## 2020-05-25 NOTE — Patient Instructions (Addendum)
Safe Medications in Pregnancy   Acne:  Benzoyl Peroxide  Salicylic Acid   Backache/Headache:  Tylenol: 2 regular strength every 4 hours OR        2 Extra strength every 6 hours   Colds/Coughs/Allergies:  Benadryl (alcohol free) 25 mg every 6 hours as needed  Breath right strips  Claritin  Cepacol throat lozenges  Chloraseptic throat spray  Cold-Eeze- up to three times per day  Cough drops, alcohol free  Flonase (by prescription only)  Guaifenesin  Mucinex  Robitussin DM (plain only, alcohol free)  Saline nasal spray/drops  Sudafed (pseudoephedrine) & Actifed * use only after [redacted] weeks gestation and if you do not have high blood pressure  Tylenol  Vicks Vaporub  Zinc lozenges  Zyrtec   Constipation:  Colace  Ducolax suppositories  Fleet enema  Glycerin suppositories  Metamucil  Milk of magnesia  Miralax  Senokot  Smooth move tea   Diarrhea:  Kaopectate  Imodium A-D   *NO pepto Bismol   Hemorrhoids:  Anusol  Anusol HC  Preparation H  Tucks   Indigestion:  Tums  Maalox  Mylanta  Zantac  Pepcid   Insomnia:  Benadryl (alcohol free) 25mg  every 6 hours as needed  Tylenol PM  Unisom, no Gelcaps   Leg Cramps:  Tums  MagGel   Nausea/Vomiting:  Bonine  Dramamine  Emetrol  Ginger extract  Sea bands  Meclizine  Nausea medication to take during pregnancy:  Unisom (doxylamine succinate 25 mg tablets) Take one tablet daily at bedtime. If symptoms are not adequately controlled, the dose can be increased to a maximum recommended dose of two tablets daily (1/2 tablet in the morning, 1/2 tablet mid-afternoon and one at bedtime).  Vitamin B6 100mg  tablets. Take one tablet twice a day (up to 200 mg per day).   Skin Rashes:  Aveeno products  Benadryl cream or 25mg  every 6 hours as needed  Calamine Lotion  1% cortisone cream   Yeast infection:  Gyne-lotrimin 7  Monistat 7    **If taking multiple medications, please check labels to avoid  duplicating the same active ingredients  **take medication as directed on the label  ** Do not exceed 4000 mg of tylenol in 24 hours  **Do not take medications that contain aspirin or ibuprofen         Morning Sickness  Morning sickness is when you feel sick to your stomach (nauseous) during pregnancy. You may feel sick to your stomach and throw up (vomit). You may feel sick in the morning, but you can feel this way at any time of day. Some women feel very sick to their stomach and cannot stop throwing up (hyperemesis gravidarum). Follow these instructions at home: Medicines  Take over-the-counter and prescription medicines only as told by your doctor. Do not take any medicines until you talk with your doctor about them first.  Taking multivitamins before getting pregnant can stop or lessen the harshness of morning sickness. Eating and drinking  Eat dry toast or crackers before getting out of bed.  Eat 5 or 6 small meals a day.  Eat dry and bland foods like rice and baked potatoes.  Do not eat greasy, fatty, or spicy foods.  Have someone cook for you if the smell of food causes you to feel sick or throw up.  If you feel sick to your stomach after taking prenatal vitamins, take them at night or with a snack.  Eat protein when you need a snack. Nuts, yogurt,  and cheese are good choices.  Drink fluids throughout the day.  Try ginger ale made with real ginger, ginger tea made from fresh grated ginger, or ginger candies. General instructions  Do not use any products that have nicotine or tobacco in them, such as cigarettes and e-cigarettes. If you need help quitting, ask your doctor.  Use an air purifier to keep the air in your house free of smells.  Get lots of fresh air.  Try to avoid smells that make you feel sick.  Try: ? Wearing a bracelet that is used for seasickness (acupressure wristband). ? Going to a doctor who puts thin needles into certain body points  (acupuncture) to improve how you feel. Contact a doctor if:  You need medicine to feel better.  You feel dizzy or light-headed.  You are losing weight. Get help right away if:  You feel very sick to your stomach and cannot stop throwing up.  You pass out (faint).  You have very bad pain in your belly. Summary  Morning sickness is when you feel sick to your stomach (nauseous) during pregnancy.  You may feel sick in the morning, but you can feel this way at any time of day.  Making some changes to what you eat may help your symptoms go away. This information is not intended to replace advice given to you by your health care provider. Make sure you discuss any questions you have with your health care provider. Document Revised: 04/26/2017 Document Reviewed: 06/14/2016 Elsevier Patient Education  2020 [employer].  Food Choices for Gastroesophageal Reflux Disease, Adult When you have gastroesophageal reflux disease (GERD), the foods you eat and your eating habits are very important. Choosing the right foods can help ease your discomfort. Think about working with a nutrition specialist (dietitian) to help you make good choices. What are tips for following this plan?  Meals  Choose healthy foods that are low in fat, such as fruits, vegetables, whole grains, low-fat dairy products, and lean meat, fish, and poultry.  Eat small meals often instead of 3 large meals a day. Eat your meals slowly, and in a place where you are relaxed. Avoid bending over or lying down until 2-3 hours after eating.  Avoid eating meals 2-3 hours before bed.  Avoid drinking a lot of liquid with meals.  Cook foods using methods other than frying. Bake, grill, or broil food instead.  Avoid or limit: ? Chocolate. ? Peppermint or spearmint. ? Alcohol. ? Pepper. ? Black and decaffeinated coffee. ? Black and decaffeinated tea. ? Bubbly (carbonated) soft drinks. ? Caffeinated energy drinks and soft  drinks.  Limit high-fat foods such as: ? Fatty meat or fried foods. ? Whole milk, cream, butter, or ice cream. ? Nuts and nut butters. ? Pastries, donuts, and sweets made with butter or shortening.  Avoid foods that cause symptoms. These foods may be different for everyone. Common foods that cause symptoms include: ? Tomatoes. ? Oranges, lemons, and limes. ? Peppers. ? Spicy food. ? Onions and garlic. ? Vinegar. Lifestyle  Maintain a healthy weight. Ask your doctor what weight is healthy for you. If you need to lose weight, work with your doctor to do so safely.  Exercise for at least 30 minutes for 5 or more days each week, or as told by your doctor.  Wear loose-fitting clothes.  Do not smoke. If you need help quitting, ask your doctor.  Sleep with the head of your bed higher than your feet. Use  a wedge under the mattress or blocks under the bed frame to raise the head of the bed. Summary  When you have gastroesophageal reflux disease (GERD), food and lifestyle choices are very important in easing your symptoms.  Eat small meals often instead of 3 large meals a day. Eat your meals slowly, and in a place where you are relaxed.  Limit high-fat foods such as fatty meat or fried foods.  Avoid bending over or lying down until 2-3 hours after eating.  Avoid peppermint and spearmint, caffeine, alcohol, and chocolate. This information is not intended to replace advice given to you by your health care provider. Make sure you discuss any questions you have with your health care provider. Document Revised: 09/04/2018 Document Reviewed: 06/19/2016 Elsevier Patient Education  2020 [employer].    [email or web address] Virtual tour

## 2020-05-26 DIAGNOSIS — O09521 Supervision of elderly multigravida, first trimester: Secondary | ICD-10-CM | POA: Insufficient documentation

## 2020-05-26 LAB — CBC/D/PLT+RPR+RH+ABO+RUB AB...
Antibody Screen: NEGATIVE
Basophils Absolute: 0.1 10*3/uL (ref 0.0–0.2)
Basos: 1 %
EOS (ABSOLUTE): 0.4 10*3/uL (ref 0.0–0.4)
Eos: 4 %
HCV Ab: <0.1 s/co ratio (ref 0.0–0.9)
HIV Screen 4th Generation wRfx: NONREACTIVE
Hematocrit: 35.7 % (ref 34.0–46.6)
Hemoglobin: 12.2 g/dL (ref 11.1–15.9)
Hepatitis B Surface Ag: NEGATIVE
Immature Grans (Abs): 0 10*3/uL (ref 0.0–0.1)
Immature Granulocytes: 0 %
Lymphocytes Absolute: 2.8 10*3/uL (ref 0.7–3.1)
Lymphs: 30 %
MCH: 29.3 pg (ref 26.6–33.0)
MCHC: 34.2 g/dL (ref 31.5–35.7)
MCV: 86 fL (ref 79–97)
Monocytes Absolute: 0.7 10*3/uL (ref 0.1–0.9)
Monocytes: 7 %
Neutrophils Absolute: 5.5 10*3/uL (ref 1.4–7.0)
Neutrophils: 58 %
Platelets: 295 10*3/uL (ref 150–450)
RBC: 4.16 x10E6/uL (ref 3.77–5.28)
RDW: 12.8 % (ref 11.7–15.4)
RPR Ser Ql: NONREACTIVE
Rh Factor: POSITIVE
Rubella Antibodies, IGG: 2.55 index (ref >0.99)
WBC: 9.5 10*3/uL (ref 3.4–10.8)

## 2020-05-26 LAB — HCV INTERPRETATION

## 2020-05-26 LAB — HEMOGLOBIN A1C
Est. average glucose Bld gHb Est-mCnc: 105 mg/dL
Hgb A1c MFr Bld: 5.3 % (ref 4.8–5.6)

## 2020-05-26 NOTE — Progress Notes (Signed)
   PRENATAL VISIT NOTE  Subjective:  Bianca Hodge is a 39 y.o. G3P1011 at [redacted]w[redacted]d being seen today for her first prenatal visit for this pregnancy.  She is currently monitored for the following issues for this low-risk pregnancy and has Irritable bowel syndrome (IBS) and Supervision of other normal pregnancy, antepartum on their problem list.  Patient reports fatigue, nausea and vomiting.  Contractions: Not present. Vag. Bleeding: None.  Movement: Absent. Denies leaking of fluid.   She is planning to breastfeed. Desires condoms for contraception.   The following portions of the patient's history were reviewed and updated as appropriate: allergies, current medications, past family history, past medical history, past social history, past surgical history and problem list.   Objective:   Vitals:   05/25/20 1542  BP: 123/64  Pulse: 72  Weight: 191 lb 8 oz (86.9 kg)    Fetal Status: Fetal Heart Rate (bpm): 167   Movement: Absent     General:  Alert, oriented and cooperative. Patient is in no acute distress.  Skin: Skin is warm and dry. No rash noted.   Cardiovascular: Normal heart rate and rhythm noted  Respiratory: Normal respiratory effort, no problems with respiration noted. Clear to auscultation.   Abdomen: Soft, gravid, appropriate for gestational age. Normal bowel sounds. Non-tender. Pain/Pressure: Absent     Pelvic: Deferred  Extremities: Normal range of motion.  Edema: None  Mental Status: Normal mood and affect. Normal behavior. Normal judgment and thought content.   Assessment and Plan:  Pregnancy: G3P1011 at [redacted]w[redacted]d 1. Supervision of other normal pregnancy, antepartum - CBC/D/Plt+RPR+Rh+ABO+Rub Ab... - CHL AMB BABYSCRIPTS SCHEDULE OPTIMIZATION - Culture, OB Urine - Genetic Screening - Panorama and Horizon - did not have full Horizon in last pregnancy - [place] MFM OB COMP + 14 WK; Future - Hemoglobin A1c - Cervicovaginal ancillary only( [place]) - Uses [facility] -  Normal pap smear 01/04/2019 - Patient wants to maintain activity level and fitness during pregnancy, discussed safe options during pregnancy  2. [redacted] weeks gestation of pregnancy  3. Nausea and vomiting in pregnancy prior to [redacted] weeks gestation  - Patient states symptoms are improving, but still frequent - Was advised by PCP to avoid Zofran and had been taking Diclegis, but the side effect of somnolence is interfering with day-to-day - We have discussed the risk and benefits of other anti-emetics, she would like to try Zofran again PRN, she feels she will only need it a few times/week  4. AMA - < 12 yo, no change in management   Preterm labor/first trimester warning symptoms and general obstetric precautions including but not limited to vaginal bleeding, contractions, leaking of fluid and fetal movement were reviewed in detail with the patient. Please refer to After Visit Summary for other counseling recommendations.   Discussed the normal visit cadence for prenatal care Discussed the nature of our practice with multiple providers including residents and students   Return in about 4 weeks (around 06/22/2020) for LOB, In-Person.  Future Appointments  Date Time Provider Department Center  06/23/2020  1:35 PM [facility]  07/13/2020  1:45 PM [facility]    Vonzella Nipple, PA-C

## 2020-05-27 LAB — CULTURE, OB URINE

## 2020-05-27 LAB — URINE CULTURE, OB REFLEX

## 2020-05-28 NOTE — L&D Delivery Note (Signed)
Delivery Note At 1:00 PM a viable and healthy female was delivered via Vaginal, Spontaneous (Presentation:   Occiput Anterior).  APGAR: 8, 9; weight 7 lb 13.9 oz (3569 g).   Placenta status: Spontaneous, Intact.  Cord: 3 vessels with the following complications: None.    Anesthesia: Epidural Episiotomy: None Lacerations: 2nd degree;Perineal Suture Repair: 3.0 monocryl Est. Blood Loss (mL): 100  Mom to postpartum.  Baby to Couplet care / Skin to Skin.  Bianca Hodge is a 40 y.o. female [phone] with IUP at [redacted]w[redacted]d admitted for SOL, elevated BP on admission .  She progressed with Pitocin augmentation to complete and pushed ~ 30 minutes to deliver.  Cord clamping delayed by several minutes then clamped by CNM and cut by father of baby.  Placenta intact and spontaneous, bleeding minimal.  Second degree laceration repaired without difficulty.  Mom and baby stable prior to transfer to postpartum. She plans on breastfeeding. She requests condoms for birth control.   Sharen Counter 11/30/2020, 3:21 PM

## 2020-06-23 ENCOUNTER — Encounter: Payer: Self-pay | Admitting: Certified Nurse Midwife

## 2020-06-23 ENCOUNTER — Other Ambulatory Visit: Payer: Self-pay

## 2020-06-23 ENCOUNTER — Ambulatory Visit (INDEPENDENT_AMBULATORY_CARE_PROVIDER_SITE_OTHER): Payer: No Typology Code available for payment source | Admitting: Certified Nurse Midwife

## 2020-06-23 VITALS — BP 120/83 | HR 74 | Wt 191.1 lb

## 2020-06-23 DIAGNOSIS — O09521 Supervision of elderly multigravida, first trimester: Secondary | ICD-10-CM

## 2020-06-23 DIAGNOSIS — Z3480 Encounter for supervision of other normal pregnancy, unspecified trimester: Secondary | ICD-10-CM

## 2020-06-23 DIAGNOSIS — Z3A16 16 weeks gestation of pregnancy: Secondary | ICD-10-CM

## 2020-06-23 NOTE — Progress Notes (Signed)
   PRENATAL VISIT NOTE  Subjective:  Bianca Hodge is a 40 y.o. G3P1011 at [redacted]w[redacted]d being seen today for ongoing prenatal care.  She is currently monitored for the following issues for this high-risk pregnancy and has Irritable bowel syndrome (IBS); Supervision of other normal pregnancy, antepartum; and AMA (advanced maternal age) multigravida 35+, first trimester on their problem list.  Patient reports hemorrhoids.  Contractions: Not present. Vag. Bleeding: None.  Movement: Present. Denies leaking of fluid.   The following portions of the patient's history were reviewed and updated as appropriate: allergies, current medications, past family history, past medical history, past social history, past surgical history and problem list.   Objective:   Vitals:   06/23/20 1350  BP: 120/83  Pulse: 74  Weight: 191 lb 1.6 oz (86.7 kg)    Fetal Status: Fetal Heart Rate (bpm): 155   Movement: Present     General:  Alert, oriented and cooperative. Patient is in no acute distress.  Skin: Skin is warm and dry. No rash noted.   Cardiovascular: Normal heart rate noted  Respiratory: Normal respiratory effort, no problems with respiration noted  Abdomen: Soft, gravid, appropriate for gestational age.  Pain/Pressure: Absent     Pelvic: Cervical exam deferred        Extremities: Normal range of motion.  Edema: None  Mental Status: Normal mood and affect. Normal behavior. Normal judgment and thought content.   Assessment and Plan:  Pregnancy: G3P1011 at [redacted]w[redacted]d 1. Supervision of other normal pregnancy, antepartum - Patient doing well, reports small external hemorrhoids- educated and discussed use of preporation H external cream or suppositories. Discussed use of sitz baths and tucks wipes as well.  - Routine prenatal care - Anticipatory guidance on upcoming appointments  - educated and discussed fetal movement during pregnancy   2. AMA (advanced maternal age) multigravida 35+, first trimester - high  risk due to age otherwise low risk  - anatomy US scheduled for 2/17  3. [redacted] weeks gestation of pregnancy - AFP, Serum, Open Spina Bifida  Preterm labor symptoms and general obstetric precautions including but not limited to vaginal bleeding, contractions, leaking of fluid and fetal movement were reviewed in detail with the patient. Please refer to After Visit Summary for other counseling recommendations.   Return in about 4 weeks (around 07/21/2020) for virtual, with APP, LROB.  Future Appointments  Date Time Provider Department Center  07/14/2020 10:45 AM [facility] US[facility]    Sharyon Cable, CNM

## 2020-06-23 NOTE — Patient Instructions (Signed)
Alpha-Fetoprotein Test Why am I having this test? The alpha-fetoprotein test is a lab test most commonly used for pregnant women to help screen for birth defects in their unborn baby. It can be used to screen for chromosome (DNA) abnormalities, problems with the brain or spinal cord, or problems with the abdominal wall of the unborn baby (fetus). The alpha-fetoprotein test may also be done for men or nonpregnant women to check for certain cancers. What is being tested? This test measures the amount of alpha-fetoprotein (AFP) in your blood. AFP is a protein that is made by the liver. Levels can be detected in the mother's blood during pregnancy, starting at 10 weeks and peaking at 16-18 weeks of the pregnancy. Abnormal levels can sometimes be a sign of a birth defect in the baby. Certain cancers can cause a high level of AFP in men and nonpregnant women. What kind of sample is taken? A blood sample is required for this test. It is usually collected by inserting a needle into a blood vessel.   How are the results reported? Your test results will be reported as values. Your health care provider will compare your results to normal ranges that were established after testing a large group of people (reference values). Reference values may vary among labs and hospitals. For this test, common reference values are:  Adult: Less than 40 ng/mL or less than 40 mcg/L (SI units).  Child younger than 1 year: Less than 30 ng/mL. If you are pregnant, the values may also vary based on how long you have been pregnant. What do the results mean? Results that are above the reference values in pregnant women may indicate the following for the baby:  Neural tube defects, such as abnormalities of the spinal cord or brain.  Abdominal wall defects.  Multiple pregnancy such as twins.  Fetal distress or fetal death. Results that are above the reference values in men or nonpregnant women may indicate:  Reproductive  cancers, such as ovarian or testicular cancer.  Liver cancer.  Liver cell death.  Other types of cancer. Very low levels of AFP in pregnant women may indicate Down syndrome for the baby. Talk with your health care provider about what your results mean. Questions to ask your health care provider Ask your health care provider, or the department that is doing the test:  When will my results be ready?  How will I get my results?  What are my treatment options?  What other tests do I need?  What are my next steps? Summary  The alpha-fetoprotein test is done on pregnant women to help screen for birth defects in their unborn baby.  Certain cancers can cause a high level of AFP in men and nonpregnant women.  For this test, a blood sample is usually collected by inserting a needle into a blood vessel.  Talk with your health care provider about what your results mean. This information is not intended to replace advice given to you by your health care provider. Make sure you discuss any questions you have with your health care provider. Document Revised: 12/04/2019 Document Reviewed: 12/04/2019 Elsevier Patient Education  2021 [employer]  

## 2020-06-30 LAB — AFP, SERUM, OPEN SPINA BIFIDA
AFP MoM: 1.19
AFP Value: 36.4 ng/mL
Gest. Age on Collection Date: 16.4 weeks
Maternal Age At EDD: 39.6 yr
OSBR Risk 1 IN: 6704
Test Results:: NEGATIVE
Weight: 191 [lb_av]

## 2020-07-13 ENCOUNTER — Ambulatory Visit: Payer: No Typology Code available for payment source

## 2020-07-14 ENCOUNTER — Other Ambulatory Visit: Payer: Self-pay

## 2020-07-14 ENCOUNTER — Ambulatory Visit: Payer: No Typology Code available for payment source | Attending: Medical

## 2020-07-14 DIAGNOSIS — Z3480 Encounter for supervision of other normal pregnancy, unspecified trimester: Secondary | ICD-10-CM | POA: Diagnosis present

## 2020-07-21 ENCOUNTER — Telehealth (INDEPENDENT_AMBULATORY_CARE_PROVIDER_SITE_OTHER): Payer: No Typology Code available for payment source | Admitting: Medical

## 2020-07-21 ENCOUNTER — Encounter: Payer: Self-pay | Admitting: Medical

## 2020-07-21 ENCOUNTER — Telehealth: Payer: No Typology Code available for payment source | Admitting: Family Medicine

## 2020-07-21 ENCOUNTER — Encounter: Payer: No Typology Code available for payment source | Admitting: Student

## 2020-07-21 VITALS — BP 110/70 | HR 73

## 2020-07-21 DIAGNOSIS — K640 First degree hemorrhoids: Secondary | ICD-10-CM

## 2020-07-21 DIAGNOSIS — Z3480 Encounter for supervision of other normal pregnancy, unspecified trimester: Secondary | ICD-10-CM

## 2020-07-21 DIAGNOSIS — Z3A20 20 weeks gestation of pregnancy: Secondary | ICD-10-CM

## 2020-07-21 DIAGNOSIS — O09522 Supervision of elderly multigravida, second trimester: Secondary | ICD-10-CM

## 2020-07-21 DIAGNOSIS — O09521 Supervision of elderly multigravida, first trimester: Secondary | ICD-10-CM

## 2020-07-21 DIAGNOSIS — O99612 Diseases of the digestive system complicating pregnancy, second trimester: Secondary | ICD-10-CM

## 2020-07-21 NOTE — Progress Notes (Signed)
I connected with Bianca Hodge 07/21/20 at  8:15 AM EST by: MyChart video and verified that I am speaking with the correct person using two identifiers.  Patient is located at home and provider is located at [facility].     The purpose of this virtual visit is to provide medical care while limiting exposure to the novel coronavirus. I discussed the limitations, risks, security and privacy concerns of performing an evaluation and management service by MyChart video and the availability of in person appointments. I also discussed with the patient that there may be a patient responsible charge related to this service. By engaging in this virtual visit, you consent to the provision of healthcare.  Additionally, you authorize for your insurance to be billed for the services provided during this visit.  The patient expressed understanding and agreed to proceed.  The following staff members participated in the virtual visit:  Corinda Gubler, CMA    PRENATAL VISIT NOTE  Subjective:  Bianca Hodge is a 40 y.o. G3P1011 at [redacted]w[redacted]d  for phone visit for ongoing prenatal care.  She is currently monitored for the following issues for this low-risk pregnancy and has Irritable bowel syndrome (IBS); Supervision of other normal pregnancy, antepartum; and AMA (advanced maternal age) multigravida 35+, first trimester on their problem list.  Patient reports hemorrhoids.  Contractions: Not present. Vag. Bleeding: None.  Movement: Absent. Denies leaking of fluid.   The following portions of the patient's history were reviewed and updated as appropriate: allergies, current medications, past family history, past medical history, past social history, past surgical history and problem list.   Objective:   Vitals:   07/21/20 0821  BP: 110/70  Pulse: 73   Self-Obtained  Fetal Status:     Movement: Absent     Assessment and Plan:  Pregnancy: G3P1011 at [redacted]w[redacted]d 1. Supervision of other normal pregnancy, antepartum - Reviewed  anatomy US - normal - EFW 93%, will scheduled 32 weeks growth scan at future visit   2. AMA (advanced maternal age) multigravida 35+, first trimester  3. [redacted] weeks gestation of pregnancy  4. Grade I hemorrhoids - Continue topical treatments, suppositories twice weekly PRN - Consider adding probiotic and increasing fiber and water intake to avoid straining or difficult to pass stools   Preterm labor symptoms and general obstetric precautions including but not limited to vaginal bleeding, contractions, leaking of fluid and fetal movement were reviewed in detail with the patient.  Return in about 4 weeks (around 08/18/2020) for LOB, In-Person, any provider.  No future appointments.   Time spent on virtual visit: 15 minutes  Vonzella Nipple, PA-C

## 2020-07-21 NOTE — Patient Instructions (Addendum)
Hemorrhoids Hemorrhoids are swollen veins that may develop:  In the butt (rectum). These are called internal hemorrhoids.  Around the opening of the butt (anus). These are called external hemorrhoids. Hemorrhoids can cause pain, itching, or bleeding. Most of the time, they do not cause serious problems. They usually get better with diet changes, lifestyle changes, and other home treatments. What are the causes? This condition may be caused by:  Having trouble pooping (constipation).  Pushing hard (straining) to poop.  Watery poop (diarrhea).  Pregnancy.  Being very overweight (obese).  Sitting for long periods of time.  Heavy lifting or other activity that causes you to strain.  Anal sex.  Riding a bike for a long period of time. What are the signs or symptoms? Symptoms of this condition include:  Pain.  Itching or soreness in the butt.  Bleeding from the butt.  Leaking poop.  Swelling in the area.  One or more lumps around the opening of your butt. How is this diagnosed? A doctor can often diagnose this condition by looking at the affected area. The doctor may also:  Do an exam that involves feeling the area with a gloved hand (digital rectal exam).  Examine the area inside your butt using a small tube (anoscope).  Order blood tests. This may be done if you have lost a lot of blood.  Have you get a test that involves looking inside the colon using a flexible tube with a camera on the end (sigmoidoscopy or colonoscopy). How is this treated? This condition can usually be treated at home. Your doctor may tell you to change what you eat, make lifestyle changes, or try home treatments. If these do not help, procedures can be done to remove the hemorrhoids or make them smaller. These may involve:  Placing rubber bands at the base of the hemorrhoids to cut off their blood supply.  Injecting medicine into the hemorrhoids to shrink them.  Shining a type of light  energy onto the hemorrhoids to cause them to fall off.  Doing surgery to remove the hemorrhoids or cut off their blood supply. Follow these instructions at home: Eating and drinking  Eat foods that have a lot of fiber in them. These include whole grains, beans, nuts, fruits, and vegetables.  Ask your doctor about taking products that have added fiber (fibersupplements).  Reduce the amount of fat in your diet. You can do this by: ? Eating low-fat dairy products. ? Eating less red meat. ? Avoiding processed foods.  Drink enough fluid to keep your pee (urine) pale yellow.   Managing pain and swelling  Take a warm-water bath (sitz bath) for 20 minutes to ease pain. Do this 3-4 times a day. You may do this in a bathtub or using a portable sitz bath that fits over the toilet.  If told, put ice on the painful area. It may be helpful to use ice between your warm baths. ? Put ice in a plastic bag. ? Place a towel between your skin and the bag. ? Leave the ice on for 20 minutes, 2-3 times a day.   General instructions  Take over-the-counter and prescription medicines only as told by your doctor. ? Medicated creams and medicines may be used as told.  Exercise often. Ask your doctor how much and what kind of exercise is best for you.  Go to the bathroom when you have the urge to poop. Do not wait.  Avoid pushing too hard when you poop.  Keep your butt dry and clean. Use wet toilet paper or moist towelettes after pooping.  Do not sit on the toilet for a long time.  Keep all follow-up visits as told by your doctor. This is important. Contact a doctor if you:  Have pain and swelling that do not get better with treatment or medicine.  Have trouble pooping.  Cannot poop.  Have pain or swelling outside the area of the hemorrhoids. Get help right away if you have:  Bleeding that will not stop. Summary  Hemorrhoids are swollen veins in the butt or around the opening of the  butt.  They can cause pain, itching, or bleeding.  Eat foods that have a lot of fiber in them. These include whole grains, beans, nuts, fruits, and vegetables.  Take a warm-water bath (sitz bath) for 20 minutes to ease pain. Do this 3-4 times a day. This information is not intended to replace advice given to you by your health care provider. Make sure you discuss any questions you have with your health care provider. Document Revised: 05/22/2018 Document Reviewed: 10/03/2017 Elsevier Patient Education  2021 Else[employer] Second Trimester of Pregnancy  The second trimester of pregnancy is from week 13 through week 27. This is also called months 4 through 6 of pregnancy. This is often the time when you feel your best. During the second trimester:  Morning sickness is less or has stopped.  You may have more energy.  You may feel hungry more often. At this time, your unborn baby (fetus) is growing very fast. At the end of the sixth month, the unborn baby may be up to 12 inches long and weigh about 1 pounds. You will likely start to feel the baby move between 16 and 20 weeks of pregnancy. Body changes during your second trimester Your body continues to go through many changes during this time. The changes vary and generally return to normal after the baby is born. Physical changes  You will gain more weight.  You may start to get stretch marks on your hips, belly (abdomen), and breasts.  Your breasts will grow and may hurt.  Dark spots or blotches may develop on your face.  A dark line from your belly button to the pubic area (linea nigra) may appear.  You may have changes in your hair. Health changes  You may have headaches.  You may have heartburn.  You may have trouble pooping (constipation).  You may have hemorrhoids or swollen, bulging veins (varicose veins).  Your gums may bleed.  You may pee (urinate) more often.  You may have back pain. Follow these  instructions at home: Medicines  Take over-the-counter and prescription medicines only as told by your doctor. Some medicines are not safe during pregnancy.  Take a prenatal vitamin that contains at least 600 micrograms (mcg) of folic acid. Eating and drinking  Eat healthy meals that include: ? Fresh fruits and vegetables. ? Whole grains. ? Good sources of protein, such as meat, eggs, or tofu. ? Low-fat dairy products.  Avoid raw meat and unpasteurized juice, milk, and cheese.  You may need to take these actions to prevent or treat trouble pooping: ? Drink enough fluids to keep your pee (urine) pale yellow. ? Eat foods that are high in fiber. These include beans, whole grains, and fresh fruits and vegetables. ? Limit foods that are high in fat and sugar. These include fried or sweet foods. Activity  Exercise only as told by your doctor.  Most people can do their usual exercise during pregnancy. Try to exercise for 30 minutes at least 5 days a week.  Stop exercising if you have pain or cramps in your belly or lower back.  Do not exercise if it is too hot or too humid, or if you are in a place of great height (high altitude).  Avoid heavy lifting.  If you choose to, you may have sex unless your doctor tells you not to. Relieving pain and discomfort  Wear a good support bra if your breasts are sore.  Take warm water baths (sitz baths) to soothe pain or discomfort caused by hemorrhoids. Use hemorrhoid cream if your doctor approves.  Rest with your legs raised (elevated) if you have leg cramps or low back pain.  If you develop bulging veins in your legs: ? Wear support hose as told by your doctor. ? Raise your feet for 15 minutes, 3-4 times a day. ? Limit salt in your food. Safety  Wear your seat belt at all times when you are in a car.  Talk with your doctor if someone is hurting you or yelling at you a lot. Lifestyle  Do not use hot tubs, steam rooms, or saunas.  Do  not douche. Do not use tampons or scented sanitary pads.  Avoid cat litter boxes and soil used by cats. These carry germs that can harm your baby and can cause a loss of your baby by miscarriage or stillbirth.  Do not use herbal medicines, illegal drugs, or medicines that are not approved by your doctor. Do not drink alcohol.  Do not smoke or use any products that contain nicotine or tobacco. If you need help quitting, ask your doctor. General instructions  Keep all follow-up visits. This is important.  Ask your doctor about local prenatal classes.  Ask your doctor about the right foods to eat or for help finding a counselor. Where to find more information  American Pregnancy Association: americanpregnancy.org  [employer]: www.acog.org  Office on [employer] [facility]: [employer] Contact a doctor if:  You have a headache that does not go away when you take medicine.  You have changes in how you see, or you see spots in front of your eyes.  You have mild cramps, pressure, or pain in your lower belly.  You continue to feel like you may vomit (nauseous), you vomit, or you have watery poop (diarrhea).  You have bad-smelling fluid coming from your vagina.  You have pain when you pee or your pee smells bad.  You have very bad swelling of your face, hands, ankles, feet, or legs.  You have a fever. Get help right away if:  You are leaking fluid from your vagina.  You have spotting or bleeding from your vagina.  You have very bad belly cramping or pain.  You have trouble breathing.  You have chest pain.  You faint.  You have not felt your baby move for the time period told by your doctor.  You have new or increased pain, swelling, or redness in an arm or leg. Summary  The second trimester of pregnancy is from week 13 through week 27 (months 4 through 6).  Eat healthy meals.  Exercise as told by your doctor. Most  people can do their usual exercise during pregnancy.  Do not use herbal medicines, illegal drugs, or medicines that are not approved by your doctor. Do not drink alcohol.  Call your doctor if you get  sick or if you notice anything unusual about your pregnancy. This information is not intended to replace advice given to you by your health care provider. Make sure you discuss any questions you have with your health care provider. Document Revised: 10/21/2019 Document Reviewed: 08/27/2019 Elsevier Patient Education  2021 Elsevier Inc.   High-Fiber Eating Plan Fiber, also called dietary fiber, is a type of carbohydrate. It is found foods such as fruits, vegetables, whole grains, and beans. A high-fiber diet can have many health benefits. Your health care provider may recommend a high-fiber diet to help:  Prevent constipation. Fiber can make your bowel movements more regular.  Lower your cholesterol.  Relieve the following conditions: ? Inflammation of veins in the anus (hemorrhoids). ? Inflammation of specific areas of the digestive tract (uncomplicated diverticulosis). ? A problem of the large intestine, also called the colon, that sometimes causes pain and diarrhea (irritable bowel syndrome, or IBS).  Prevent overeating as part of a weight-loss plan.  Prevent heart disease, type 2 diabetes, and certain cancers. What are tips for following this plan? Reading food labels  Check the nutrition facts label on food products for the amount of dietary fiber. Choose foods that have 5 grams of fiber or more per serving.  The goals for recommended daily fiber intake include: ? Men (age 40 or younger): 34-38 g. ? Men (over age 40): 28-34 g. ? Women (age 40 or younger): 25-28 g. ? Women (over age 40): 22-25 g. Your daily fiber goal is _____________ g.   Shopping  Choose whole fruits and vegetables instead of processed forms, such as apple juice or applesauce.  Choose a wide variety of  high-fiber foods such as avocados, lentils, oats, and kidney beans.  Read the nutrition facts label of the foods you choose. Be aware of foods with added fiber. These foods often have high sugar and sodium amounts per serving. Cooking  Use whole-grain flour for baking and cooking.  Cook with brown rice instead of white rice. Meal planning  Start the day with a breakfast that is high in fiber, such as a cereal that contains 5 g of fiber or more per serving.  Eat breads and cereals that are made with whole-grain flour instead of refined flour or white flour.  Eat brown rice, bulgur wheat, or millet instead of white rice.  Use beans in place of meat in soups, salads, and pasta dishes.  Be sure that half of the grains you eat each day are whole grains. General information  You can get the recommended daily intake of dietary fiber by: ? Eating a variety of fruits, vegetables, grains, nuts, and beans. ? Taking a fiber supplement if you are not able to take in enough fiber in your diet. It is better to get fiber through food than from a supplement.  Gradually increase how much fiber you consume. If you increase your intake of dietary fiber too quickly, you may have bloating, cramping, or gas.  Drink plenty of water to help you digest fiber.  Choose high-fiber snacks, such as berries, raw vegetables, nuts, and popcorn. What foods should I eat? Fruits Berries. Pears. Apples. Oranges. Avocado. Prunes and raisins. Dried figs. Vegetables Sweet potatoes. Spinach. Kale. Artichokes. Cabbage. Broccoli. Cauliflower. Green peas. Carrots. Squash. Grains Whole-grain breads. Multigrain cereal. Oats and oatmeal. Brown rice. Barley. Bulgur wheat. Millet. Quinoa. Bran muffins. Popcorn. Rye wafer crackers. Meats and other proteins Navy beans, kidney beans, and pinto beans. Soybeans. Split peas. Lentils. Nuts and seeds.  Dairy Fiber-fortified yogurt. Beverages Fiber-fortified soy milk. Fiber-fortified  orange juice. Other foods Fiber bars. The items listed above may not be a complete list of recommended foods and beverages. Contact a dietitian for more information. What foods should I avoid? Fruits Fruit juice. Cooked, strained fruit. Vegetables Fried potatoes. Canned vegetables. Well-cooked vegetables. Grains White bread. Pasta made with refined flour. White rice. Meats and other proteins Fatty cuts of meat. Fried chicken or fried fish. Dairy Milk. Yogurt. Cream cheese. Sour cream. Fats and oils Butters. Beverages Soft drinks. Other foods Cakes and pastries. The items listed above may not be a complete list of foods and beverages to avoid. Talk with your dietitian about what choices are best for you. Summary  Fiber is a type of carbohydrate. It is found in foods such as fruits, vegetables, whole grains, and beans.  A high-fiber diet has many benefits. It can help to prevent constipation, lower blood cholesterol, aid weight loss, and reduce your risk of heart disease, diabetes, and certain cancers.  Increase your intake of fiber gradually. Increasing fiber too quickly may cause cramping, bloating, and gas. Drink plenty of water while you increase the amount of fiber you consume.  The best sources of fiber include whole fruits and vegetables, whole grains, nuts, seeds, and beans. This information is not intended to replace advice given to you by your health care provider. Make sure you discuss any questions you have with your health care provider. Document Revised: 09/17/2019 Document Reviewed: 09/17/2019 Elsevier Patient Education  2021 Elsevier Inc.  Safe Medications in Pregnancy   Acne:  Benzoyl Peroxide  Salicylic Acid   Backache/Headache:  Tylenol: 2 regular strength every 4 hours OR        2 Extra strength every 6 hours   Colds/Coughs/Allergies:  Benadryl (alcohol free) 25 mg every 6 hours as needed  Breath right strips  Claritin  Cepacol throat  lozenges  Chloraseptic throat spray  Cold-Eeze- up to three times per day  Cough drops, alcohol free  Flonase (by prescription only)  Guaifenesin  Mucinex  Robitussin DM (plain only, alcohol free)  Saline nasal spray/drops  Sudafed (pseudoephedrine) & Actifed * use only after [redacted] weeks gestation and if you do not have high blood pressure  Tylenol  Vicks Vaporub  Zinc lozenges  Zyrtec   Constipation:  Colace  Ducolax suppositories  Fleet enema  Glycerin suppositories  Metamucil  Milk of magnesia  Miralax  Senokot  Smooth move tea   Diarrhea:  Kaopectate  Imodium A-D   *NO pepto Bismol   Hemorrhoids:  Anusol  Anusol HC  Preparation H  Tucks   Indigestion:  Tums  Maalox  Mylanta  Zantac  Pepcid   Insomnia:  Benadryl (alcohol free)  every 6 hours as needed  Tylenol PM  Unisom, no Gelcaps   Leg Cramps:  Tums  MagGel   Nausea/Vomiting:  Bonine  Dramamine  Emetrol  Ginger extract  Sea bands  Meclizine  Nausea medication to take during pregnancy:  Unisom (doxylamine succinate 25 mg tablets) Take one tablet daily at bedtime. If symptoms are not adequately controlled, the dose can be increased to a maximum recommended dose of two tablets daily (1/2 tablet in the morning, 1/2 tablet mid-afternoon and one at bedtime).  Vitamin B6  tablets. Take one tablet twice a day (up to 200 mg per day).   Skin Rashes:  Aveeno products  Benadryl cream or  every 6 hours as needed  Calamine Lotion  1% cortisone cream  Yeast infection:  Gyne-lotrimin 7  Monistat 7    **If taking multiple medications, please check labels to avoid duplicating the same active ingredients  **take medication as directed on the label  ** Do not exceed 4000 mg of tylenol in 24 hours  **Do not take medications that contain aspirin or ibuprofen

## 2020-07-22 ENCOUNTER — Encounter: Payer: Self-pay | Admitting: *Deleted

## 2020-07-29 ENCOUNTER — Encounter: Payer: Self-pay | Admitting: *Deleted

## 2020-08-18 ENCOUNTER — Other Ambulatory Visit: Payer: Self-pay

## 2020-08-18 ENCOUNTER — Ambulatory Visit (INDEPENDENT_AMBULATORY_CARE_PROVIDER_SITE_OTHER): Payer: No Typology Code available for payment source

## 2020-08-18 VITALS — BP 115/67 | HR 74 | Wt 203.2 lb

## 2020-08-18 DIAGNOSIS — O26842 Uterine size-date discrepancy, second trimester: Secondary | ICD-10-CM

## 2020-08-18 DIAGNOSIS — Z3A24 24 weeks gestation of pregnancy: Secondary | ICD-10-CM

## 2020-08-18 DIAGNOSIS — Z3480 Encounter for supervision of other normal pregnancy, unspecified trimester: Secondary | ICD-10-CM

## 2020-08-18 NOTE — Progress Notes (Signed)
HIGH-RISK PREGNANCY OFFICE VISIT  Patient name: Bianca Hodge MRN [id number]  Date of birth: 1981/04/20 Chief Complaint:   Routine Prenatal Visit  Subjective:   Bianca Hodge is a 40 y.o. G78P1011 female at [redacted]w[redacted]d with an Estimated Date of Delivery: 12/05/20 being seen today for ongoing management of a high-risk pregnancy aeb has Irritable bowel syndrome (IBS); Supervision of other normal pregnancy, antepartum; and AMA (advanced maternal age) multigravida 35+, first trimester on their problem list.  Patient presents today with complaint c/w pregnancy such as fatigue.  Patient endorses fetal movement. Patient denies vaginal concerns including abnormal discharge, leaking of fluid, and bleeding. She denies abdominal cramping or contractions.  She questions if it is safe to fly in 4 weeks.  Contractions: Not present. Vag. Bleeding: None.  Movement: Absent.  Reviewed past medical,surgical, social, obstetrical and family history as well as problem list, medications and allergies.  Objective   Vitals:   08/18/20 0936  BP: 115/67  Pulse: 74  Weight: 203 lb 3.2 oz (92.2 kg)  Body mass index is 31.83 kg/m.  Total Weight Gain:20 lb 3.2 oz (9.163 kg)         Physical Examination:   General appearance: Well appearing, and in no distress  Mental status: Alert, oriented to person, place, and time  Skin: Warm & dry  Cardiovascular: Normal heart rate noted  Respiratory: Normal respiratory effort, no distress  Abdomen: Soft, gravid, nontender, AGA with Fundal Height: 28 cm  Pelvic: Cervical exam deferred           Extremities: Edema: None  Fetal Status: Fetal Heart Rate (bpm): 143  Movement: Absent   No results found for this or any previous visit (from the past 24 hour(s)).  Assessment & Plan:  High-risk pregnancy of a 40 y.o., G3P1011 at [redacted]w[redacted]d with an Estimated Date of Delivery: 12/05/20   1. Supervision of other normal pregnancy, antepartum -Anticipatory guidance for upcoming  appts. -Patient to next appt in 4 weeks for an in-person visit. -Reviewed Glucola appt preparation including fasting the night before and morning of.   -Discussed anticipated office time of 2.5-3 hours.  -Reviewed blood draw procedures and labs which also include check of iron level.  -Discussed how results of GTT are handled including diabetic education and BS testing for abnormal results and routine care for normal results.   2. [redacted] weeks gestation of pregnancy -Doing well overall. -Informed that she is safe to travel in 4 weeks. Instructed to wear compression stockings and ambulate every 2 hours.   3. Uterine size date discrepancy pregnancy, second trimester -FH at 28 weeks. -Discussed MFM recommendation of starting growth [place] at 32 weeks. -Patient declines stating that insurance does not cover [place]. -Reassured that we will monitor FH and if she continues to measure >GA, strong recommendation would be for growth [place]. -Questions and concerns addressed regarding causes of increased FH.  Reassured that no treatment necessary now and this does not mean she will necessarily have a C/S.    Meds: No orders of the defined types were placed in this encounter.  Labs/procedures today:  Lab Orders  No laboratory test(s) ordered today     Reviewed: Preterm labor symptoms and general obstetric precautions including but not limited to vaginal bleeding, contractions, leaking of fluid and fetal movement were reviewed in detail with the patient.  All questions were answered.  Follow-up: Return in about 4 weeks (around 09/15/2020) for HROB with GTT.  No orders of the defined types were placed in  this encounter.  Cherre Robins MSN, CNM 08/18/2020

## 2020-08-18 NOTE — Patient Instructions (Signed)
Oral Glucose Tolerance Test During Pregnancy Why am I having this test? The oral glucose tolerance test (OGTT) is done to check how your body processes blood sugar (glucose). This is one of several tests used to diagnose diabetes that develops during pregnancy (gestational diabetes mellitus). Gestational diabetes is a short-term form of diabetes that some women develop while they are pregnant. It usually occurs during the second trimester of pregnancy and goes away after delivery. Testing, or screening, for gestational diabetes usually occurs at weeks 24-28 of pregnancy. You may have the OGTT test after having a 1-hour glucose screening test if the results from that test indicate that you may have gestational diabetes. This test may also be needed if:  You have a history of gestational diabetes.  There is a history of giving birth to very large babies or of losing pregnancies (having stillbirths).  You have signs and symptoms of diabetes, such as: ? Changes in your eyesight. ? Tingling or numbness in your hands or feet. ? Changes in hunger, thirst, and urination, and these are not explained by your pregnancy. What is being tested? This test measures the amount of glucose in your blood at different times during a period of 3 hours. This shows how well your body can process glucose. What kind of sample is taken? Blood samples are required for this test. They are usually collected by inserting a needle into a blood vessel.   How do I prepare for this test?  For 3 days before your test, eat normally. Have plenty of carbohydrate-rich foods.  Follow instructions from your health care provider about: ? Eating or drinking restrictions on the day of the test. You may be asked not to eat or drink anything other than water (to fast) starting 8-10 hours before the test. ? Changing or stopping your regular medicines. Some medicines may interfere with this test. Tell a health care provider about:  All  medicines you are taking, including vitamins, herbs, eye drops, creams, and over-the-counter medicines.  Any blood disorders you have.  Any surgeries you have had.  Any medical conditions you have. What happens during the test? First, your blood glucose will be measured. This is referred to as your fasting blood glucose because you fasted before the test. Then, you will drink a glucose solution that contains a certain amount of glucose. Your blood glucose will be measured again 1, 2, and 3 hours after you drink the solution. This test takes about 3 hours to complete. You will need to stay at the testing location during this time. During the testing period:  Do not eat or drink anything other than the glucose solution.  Do not exercise.  Do not use any products that contain nicotine or tobacco, such as cigarettes, e-cigarettes, and chewing tobacco. These can affect your test results. If you need help quitting, ask your health care provider. The testing procedure may vary among health care providers and hospitals. How are the results reported? Your results will be reported as milligrams of glucose per deciliter of blood (mg/dL) or millimoles per liter (mmol/L). There is more than one source for screening and diagnosis reference values used to diagnose gestational diabetes. Your health care provider will compare your results to normal values that were established after testing a large group of people (reference values). Reference values may vary among labs and hospitals. For this test ([facility]arpenter-Coustan), reference values are:  Fasting: 95 mg/dL (5.3 mmol/L).  1 hour: 180 mg/dL (10.0 mmol/L).  2 hour:   155 mg/dL (8.6 mmol/L).  3 hour: 140 mg/dL (7.8 mmol/L). What do the results mean? Results below the reference values are considered normal. If two or more of your blood glucose levels are at or above the reference values, you may be diagnosed with gestational diabetes. If only one level is  high, your health care provider may suggest repeat testing or other tests to confirm a diagnosis. Talk with your health care provider about what your results mean. Questions to ask your health care provider Ask your health care provider, or the department that is doing the test:  When will my results be ready?  How will I get my results?  What are my treatment options?  What other tests do I need?  What are my next steps? Summary  The oral glucose tolerance test (OGTT) is one of several tests used to diagnose diabetes that develops during pregnancy (gestational diabetes mellitus). Gestational diabetes is a short-term form of diabetes that some women develop while they are pregnant.  You may have the OGTT test after having a 1-hour glucose screening test if the results from that test show that you may have gestational diabetes. You may also have this test if you have any symptoms or risk factors for this type of diabetes.  Talk with your health care provider about what your results mean. This information is not intended to replace advice given to you by your health care provider. Make sure you discuss any questions you have with your health care provider. Document Revised: 10/22/2019 Document Reviewed: 10/22/2019 Elsevier Patient Education  2021 Else[employer] Inc.  

## 2020-09-14 ENCOUNTER — Other Ambulatory Visit: Payer: Self-pay | Admitting: Lactation Services

## 2020-09-14 DIAGNOSIS — Z3480 Encounter for supervision of other normal pregnancy, unspecified trimester: Secondary | ICD-10-CM

## 2020-09-16 ENCOUNTER — Other Ambulatory Visit: Payer: No Typology Code available for payment source

## 2020-09-16 ENCOUNTER — Other Ambulatory Visit: Payer: Self-pay

## 2020-09-16 ENCOUNTER — Ambulatory Visit (INDEPENDENT_AMBULATORY_CARE_PROVIDER_SITE_OTHER): Payer: No Typology Code available for payment source | Admitting: Obstetrics and Gynecology

## 2020-09-16 VITALS — BP 116/68 | HR 71 | Wt 206.7 lb

## 2020-09-16 DIAGNOSIS — K589 Irritable bowel syndrome without diarrhea: Secondary | ICD-10-CM

## 2020-09-16 DIAGNOSIS — Z3A28 28 weeks gestation of pregnancy: Secondary | ICD-10-CM | POA: Insufficient documentation

## 2020-09-16 DIAGNOSIS — Z3480 Encounter for supervision of other normal pregnancy, unspecified trimester: Secondary | ICD-10-CM

## 2020-09-16 DIAGNOSIS — O09521 Supervision of elderly multigravida, first trimester: Secondary | ICD-10-CM

## 2020-09-16 NOTE — Patient Instructions (Signed)
Oral Glucose Tolerance Test During Pregnancy Why am I having this test? The oral glucose tolerance test (OGTT) is done to check how your body processes blood sugar (glucose). This is one of several tests used to diagnose diabetes that develops during pregnancy (gestational diabetes mellitus). Gestational diabetes is a short-term form of diabetes that some women develop while they are pregnant. It usually occurs during the second trimester of pregnancy and goes away after delivery. Testing, or screening, for gestational diabetes usually occurs at weeks 24-28 of pregnancy. You may have the OGTT test after having a 1-hour glucose screening test if the results from that test indicate that you may have gestational diabetes. This test may also be needed if:  You have a history of gestational diabetes.  There is a history of giving birth to very large babies or of losing pregnancies (having stillbirths).  You have signs and symptoms of diabetes, such as: ? Changes in your eyesight. ? Tingling or numbness in your hands or feet. ? Changes in hunger, thirst, and urination, and these are not explained by your pregnancy. What is being tested? This test measures the amount of glucose in your blood at different times during a period of 3 hours. This shows how well your body can process glucose. What kind of sample is taken? Blood samples are required for this test. They are usually collected by inserting a needle into a blood vessel.   How do I prepare for this test?  For 3 days before your test, eat normally. Have plenty of carbohydrate-rich foods.  Follow instructions from your health care provider about: ? Eating or drinking restrictions on the day of the test. You may be asked not to eat or drink anything other than water (to fast) starting 8-10 hours before the test. ? Changing or stopping your regular medicines. Some medicines may interfere with this test. Tell a health care provider about:  All  medicines you are taking, including vitamins, herbs, eye drops, creams, and over-the-counter medicines.  Any blood disorders you have.  Any surgeries you have had.  Any medical conditions you have. What happens during the test? First, your blood glucose will be measured. This is referred to as your fasting blood glucose because you fasted before the test. Then, you will drink a glucose solution that contains a certain amount of glucose. Your blood glucose will be measured again 1, 2, and 3 hours after you drink the solution. This test takes about 3 hours to complete. You will need to stay at the testing location during this time. During the testing period:  Do not eat or drink anything other than the glucose solution.  Do not exercise.  Do not use any products that contain nicotine or tobacco, such as cigarettes, e-cigarettes, and chewing tobacco. These can affect your test results. If you need help quitting, ask your health care provider. The testing procedure may vary among health care providers and hospitals. How are the results reported? Your results will be reported as milligrams of glucose per deciliter of blood (mg/dL) or millimoles per liter (mmol/L). There is more than one source for screening and diagnosis reference values used to diagnose gestational diabetes. Your health care provider will compare your results to normal values that were established after testing a large group of people (reference values). Reference values may vary among labs and hospitals. For this test ([facility]arpenter-Coustan), reference values are:  Fasting: 95 mg/dL (5.3 mmol/L).  1 hour: 180 mg/dL (10.0 mmol/L).  2 hour:   155 mg/dL (8.6 mmol/L).  3 hour: 140 mg/dL (7.8 mmol/L). What do the results mean? Results below the reference values are considered normal. If two or more of your blood glucose levels are at or above the reference values, you may be diagnosed with gestational diabetes. If only one level is  high, your health care provider may suggest repeat testing or other tests to confirm a diagnosis. Talk with your health care provider about what your results mean. Questions to ask your health care provider Ask your health care provider, or the department that is doing the test:  When will my results be ready?  How will I get my results?  What are my treatment options?  What other tests do I need?  What are my next steps? Summary  The oral glucose tolerance test (OGTT) is one of several tests used to diagnose diabetes that develops during pregnancy (gestational diabetes mellitus). Gestational diabetes is a short-term form of diabetes that some women develop while they are pregnant.  You may have the OGTT test after having a 1-hour glucose screening test if the results from that test show that you may have gestational diabetes. You may also have this test if you have any symptoms or risk factors for this type of diabetes.  Talk with your health care provider about what your results mean. This information is not intended to replace advice given to you by your health care provider. Make sure you discuss any questions you have with your health care provider. Document Revised: 10/22/2019 Document Reviewed: 10/22/2019 Elsevier Patient Education  2021 Else[employer] Inc.  

## 2020-09-16 NOTE — Progress Notes (Signed)
   PRENATAL VISIT NOTE  Subjective:  Bianca Hodge is a 40 y.o. G3P1011 at [redacted]w[redacted]d being seen today for ongoing prenatal care.  She is currently monitored for the following issues for this high-risk pregnancy and has Irritable bowel syndrome (IBS); Supervision of other normal pregnancy, antepartum; AMA (advanced maternal age) multigravida 35+, first trimester; Uterine size date discrepancy pregnancy, second trimester; and [redacted] weeks gestation of pregnancy on their problem list.  Patient doing well with no acute concerns today. She reports no complaints.  Contractions: Not present. Vag. Bleeding: None.  Movement: Present. Denies leaking of fluid.   The following portions of the patient's history were reviewed and updated as appropriate: allergies, current medications, past family history, past medical history, past social history, past surgical history and problem list. Problem list updated.  Objective:   Vitals:   09/16/20 0833  BP: 116/68  Pulse: 71  Weight: 206 lb 11.2 oz (93.8 kg)    Fetal Status: Fetal Heart Rate (bpm): 136 Fundal Height: 29 cm Movement: Present     General:  Alert, oriented and cooperative. Patient is in no acute distress.  Skin: Skin is warm and dry. No rash noted.   Cardiovascular: Normal heart rate noted  Respiratory: Normal respiratory effort, no problems with respiration noted  Abdomen: Soft, gravid, appropriate for gestational age.  Pain/Pressure: Present     Pelvic: Cervical exam deferred        Extremities: Normal range of motion.  Edema: None  Mental Status:  Normal mood and affect. Normal behavior. Normal judgment and thought content.   Assessment and Plan:  Pregnancy: G3P1011 at [redacted]w[redacted]d  1. [redacted] weeks gestation of pregnancy   2. AMA (advanced maternal age) multigravida 35+, first trimester   3. Supervision of other normal pregnancy, antepartum 28 week labs today, pt is ambivalent about growth scan at 32 weeks due to cost.  She had high out of pocket  with anatomy scan.  Discuss with pt at next visit does she want growth at 32 weeks Tdap at next visit per pt request.  Large size and date discrepancy was not noted today  4. Irritable bowel syndrome, unspecified type   Preterm labor symptoms and general obstetric precautions including but not limited to vaginal bleeding, contractions, leaking of fluid and fetal movement were reviewed in detail with the patient.  Please refer to After Visit Summary for other counseling recommendations.   Return in about 2 weeks (around 09/30/2020) for [facility], in person, Tdap.   Mariel Aloe, MD Faculty Attending [facility]

## 2020-09-17 LAB — CBC
Hematocrit: 31 % — ABNORMAL LOW (ref 34.0–46.6)
Hemoglobin: 10.1 g/dL — ABNORMAL LOW (ref 11.1–15.9)
MCH: 28.1 pg (ref 26.6–33.0)
MCHC: 32.6 g/dL (ref 31.5–35.7)
MCV: 86 fL (ref 79–97)
Platelets: 273 10*3/uL (ref 150–450)
RBC: 3.6 x10E6/uL — ABNORMAL LOW (ref 3.77–5.28)
RDW: 12.2 % (ref 11.7–15.4)
WBC: 10 10*3/uL (ref 3.4–10.8)

## 2020-09-17 LAB — GLUCOSE TOLERANCE, 2 HOURS W/ 1HR
Glucose, 1 hour: 104 mg/dL (ref 65–179)
Glucose, 2 hour: 107 mg/dL (ref 65–152)
Glucose, Fasting: 87 mg/dL (ref 65–91)

## 2020-09-17 LAB — RPR: RPR Ser Ql: NONREACTIVE

## 2020-09-17 LAB — HIV ANTIBODY (ROUTINE TESTING W REFLEX): HIV Screen 4th Generation wRfx: NONREACTIVE

## 2020-09-29 ENCOUNTER — Encounter: Payer: Self-pay | Admitting: Obstetrics and Gynecology

## 2020-09-29 ENCOUNTER — Ambulatory Visit (INDEPENDENT_AMBULATORY_CARE_PROVIDER_SITE_OTHER): Payer: No Typology Code available for payment source | Admitting: Obstetrics and Gynecology

## 2020-09-29 ENCOUNTER — Other Ambulatory Visit: Payer: Self-pay

## 2020-09-29 VITALS — BP 122/64 | HR 77 | Wt 209.7 lb

## 2020-09-29 DIAGNOSIS — Z23 Encounter for immunization: Secondary | ICD-10-CM | POA: Diagnosis not present

## 2020-09-29 DIAGNOSIS — Z3480 Encounter for supervision of other normal pregnancy, unspecified trimester: Secondary | ICD-10-CM

## 2020-09-29 DIAGNOSIS — O09521 Supervision of elderly multigravida, first trimester: Secondary | ICD-10-CM

## 2020-09-29 DIAGNOSIS — O26842 Uterine size-date discrepancy, second trimester: Secondary | ICD-10-CM

## 2020-09-29 NOTE — Patient Instructions (Signed)

## 2020-09-29 NOTE — Progress Notes (Signed)
Subjective:  Bianca Hodge is a 40 y.o. G3P1011 at [redacted]w[redacted]d being seen today for ongoing prenatal care.  She is currently monitored for the following issues for this high-risk pregnancy and has Irritable bowel syndrome (IBS); Supervision of other normal pregnancy, antepartum; AMA (advanced maternal age) multigravida 35+, first trimester; and Uterine size date discrepancy pregnancy, second trimester on their problem list.   Patient reports general discomforts of pregnancy.  Contractions: Not present. Vag. Bleeding: None.  Movement: Present. Denies leaking of fluid.   The following portions of the patient's history were reviewed and updated as appropriate: allergies, current medications, past family history, past medical history, past social history, past surgical history and problem list. Problem list updated.  Objective:   Vitals:   09/29/20 1022  BP: 122/64  Pulse: 77  Weight: 209 lb 11.2 oz (95.1 kg)    Fetal Status: Fetal Heart Rate (bpm): 153   Movement: Present     General:  Alert, oriented and cooperative. Patient is in no acute distress.  Skin: Skin is warm and dry. No rash noted.   Cardiovascular: Normal heart rate noted  Respiratory: Normal respiratory effort, no problems with respiration noted  Abdomen: Soft, gravid, appropriate for gestational age. Pain/Pressure: Present     Pelvic:  Cervical exam deferred        Extremities: Normal range of motion.  Edema: Trace  Mental Status: Normal mood and affect. Normal behavior. Normal judgment and thought content.   Urinalysis:      Assessment and Plan:  Pregnancy: G3P1011 at [redacted]w[redacted]d  1. Supervision of other normal pregnancy, antepartum Stable - Tdap vaccine greater than or equal to 7yo IM  2. AMA (advanced maternal age) multigravida 35+, first trimester Stable  3. Uterine size date discrepancy pregnancy, second trimester Declined f/u growth scan  Preterm labor symptoms and general obstetric precautions including but not  limited to vaginal bleeding, contractions, leaking of fluid and fetal movement were reviewed in detail with the patient. Please refer to After Visit Summary for other counseling recommendations.  Return in about 2 weeks (around 10/13/2020) for OB visit, face to face, MD only.   Hermina Staggers, MD

## 2020-10-13 ENCOUNTER — Ambulatory Visit (INDEPENDENT_AMBULATORY_CARE_PROVIDER_SITE_OTHER): Payer: No Typology Code available for payment source | Admitting: Obstetrics and Gynecology

## 2020-10-13 ENCOUNTER — Other Ambulatory Visit: Payer: Self-pay

## 2020-10-13 ENCOUNTER — Encounter: Payer: Self-pay | Admitting: Obstetrics and Gynecology

## 2020-10-13 VITALS — BP 129/84 | HR 73 | Wt 209.1 lb

## 2020-10-13 DIAGNOSIS — O09521 Supervision of elderly multigravida, first trimester: Secondary | ICD-10-CM

## 2020-10-13 DIAGNOSIS — O0990 Supervision of high risk pregnancy, unspecified, unspecified trimester: Secondary | ICD-10-CM

## 2020-10-13 DIAGNOSIS — O26842 Uterine size-date discrepancy, second trimester: Secondary | ICD-10-CM

## 2020-10-13 NOTE — Progress Notes (Signed)
   PRENATAL VISIT NOTE  Subjective:  Bianca Hodge is a 40 y.o. G3P1011 at [redacted]w[redacted]d being seen today for ongoing prenatal care.  She is currently monitored for the following issues for this high-risk pregnancy and has Irritable bowel syndrome (IBS); Supervision of high risk pregnancy, antepartum; AMA (advanced maternal age) multigravida 35+, first trimester; and Size > dates on their problem list.  Patient reports no complaints.  Contractions: Not present. Vag. Bleeding: None.  Movement: Present. Denies leaking of fluid.   The following portions of the patient's history were reviewed and updated as appropriate: allergies, current medications, past family history, past medical history, past social history, past surgical history and problem list.   Objective:   Vitals:   10/13/20 1539  BP: 129/84  Pulse: 73  Weight: 209 lb 1.6 oz (94.8 kg)    Fetal Status: Fetal Heart Rate (bpm): 134   Movement: Present     General:  Alert, oriented and cooperative. Patient is in no acute distress.  Skin: Skin is warm and dry. No rash noted.   Cardiovascular: Normal heart rate noted  Respiratory: Normal respiratory effort, no problems with respiration noted  Abdomen: Soft, gravid, appropriate for gestational age.  Pain/Pressure: Present     Pelvic: Cervical exam deferred        Extremities: Normal range of motion.  Edema: Mild pitting, slight indentation  Mental Status: Normal mood and affect. Normal behavior. Normal judgment and thought content.   Assessment and Plan:  Pregnancy: G3P1011 at [redacted]w[redacted]d 1. Supervision of high risk pregnancy, antepartum Routine care. D/w pt re: bc nv  2. AMA (advanced maternal age) multigravida 35+, first trimester No issues  3. Size > dates ULN for FHs at 35. Pt states it feels bigger than prior child. I told her I recommend growth u/s to assess weight, afi. Pt declines currently.  Preterm labor symptoms and general obstetric precautions including but not limited to  vaginal bleeding, contractions, leaking of fluid and fetal movement were reviewed in detail with the patient. Please refer to After Visit Summary for other counseling recommendations.   Return in about 2 weeks (around 10/27/2020) for in person, low risk, md or app.  No future appointments.  Palmer Bing, MD

## 2020-10-26 ENCOUNTER — Telehealth: Payer: Self-pay | Admitting: Family Medicine

## 2020-10-26 NOTE — Telephone Encounter (Signed)
Called and left patient a detailed message about appointment change.

## 2020-10-27 ENCOUNTER — Encounter: Payer: No Typology Code available for payment source | Admitting: Family Medicine

## 2020-10-31 ENCOUNTER — Encounter: Payer: No Typology Code available for payment source | Admitting: Obstetrics and Gynecology

## 2020-11-01 ENCOUNTER — Ambulatory Visit (INDEPENDENT_AMBULATORY_CARE_PROVIDER_SITE_OTHER): Payer: No Typology Code available for payment source | Admitting: Family Medicine

## 2020-11-01 ENCOUNTER — Encounter: Payer: Self-pay | Admitting: Family Medicine

## 2020-11-01 ENCOUNTER — Other Ambulatory Visit: Payer: Self-pay

## 2020-11-01 VITALS — BP 114/68 | HR 82 | Wt 212.8 lb

## 2020-11-01 DIAGNOSIS — O09521 Supervision of elderly multigravida, first trimester: Secondary | ICD-10-CM

## 2020-11-01 DIAGNOSIS — O0990 Supervision of high risk pregnancy, unspecified, unspecified trimester: Secondary | ICD-10-CM

## 2020-11-01 DIAGNOSIS — O26842 Uterine size-date discrepancy, second trimester: Secondary | ICD-10-CM

## 2020-11-01 NOTE — Progress Notes (Signed)
   Subjective:  Bianca Hodge is a 40 y.o. G3P1011 at [redacted]w[redacted]d being seen today for ongoing prenatal care.  She is currently monitored for the following issues for this low-risk pregnancy and has Irritable bowel syndrome (IBS); Supervision of high risk pregnancy, antepartum; AMA (advanced maternal age) multigravida 35+, first trimester; and Size > dates on their problem list.  Patient reports no complaints.  Contractions: Not present. Vag. Bleeding: None.  Movement: Present. Denies leaking of fluid.   The following portions of the patient's history were reviewed and updated as appropriate: allergies, current medications, past family history, past medical history, past social history, past surgical history and problem list. Problem list updated.  Objective:   Vitals:   11/01/20 0850  BP: 114/68  Pulse: 82  Weight: 212 lb 12.8 oz (96.5 kg)    Fetal Status: Fetal Heart Rate (bpm): 157   Movement: Present     General:  Alert, oriented and cooperative. Patient is in no acute distress.  Skin: Skin is warm and dry. No rash noted.   Cardiovascular: Normal heart rate noted  Respiratory: Normal respiratory effort, no problems with respiration noted  Abdomen: Soft, gravid, appropriate for gestational age. Pain/Pressure: Present     Pelvic: Vag. Bleeding: None     Cervical exam deferred        Extremities: Normal range of motion.  Edema: Trace  Mental Status: Normal mood and affect. Normal behavior. Normal judgment and thought content.   Urinalysis:      Assessment and Plan:  Pregnancy: G3P1011 at [redacted]w[redacted]d  1. Supervision of high risk pregnancy, antepartum BP and FHR normal Many questions reviewed regarding 4th covid shot (insufficient evidence to recommend), [place] swimming in pool (no objection) Swabs next visit  2. Size > dates 36cm today ctm closely, defer growth [place] at this time Consider 39wk IOL pending course  3. AMA (advanced maternal  age) multigravida 35+, first trimester   Preterm labor symptoms and general obstetric precautions including but not limited to vaginal bleeding, contractions, leaking of fluid and fetal movement were reviewed in detail with the patient. Please refer to After Visit Summary for other counseling recommendations.  Return in 1 week (on 11/08/2020) for [facility] ob visit.   Venora Maples, MD

## 2020-11-01 NOTE — Patient Instructions (Signed)
 Contraception Choices Contraception, also called birth control, refers to methods or devices that prevent pregnancy. Hormonal methods Contraceptive implant A contraceptive implant is a thin, plastic tube that contains a hormone that prevents pregnancy. It is different from an intrauterine device (IUD). It is inserted into the upper part of the arm by a health care provider. Implants can be effective for up to 3 years. Progestin-only injections Progestin-only injections are injections of progestin, a synthetic form of the hormone progesterone. They are given every 3 months by a health care provider. Birth control pills Birth control pills are pills that contain hormones that prevent pregnancy. They must be taken once a day, preferably at the same time each day. A prescription is needed to use this method of contraception. Birth control patch The birth control patch contains hormones that prevent pregnancy. It is placed on the skin and must be changed once a week for three weeks and removed on the fourth week. A prescription is needed to use this method of contraception. Vaginal ring A vaginal ring contains hormones that prevent pregnancy. It is placed in the vagina for three weeks and removed on the fourth week. After that, the process is repeated with a new ring. A prescription is needed to use this method of contraception. Emergency contraceptive Emergency contraceptives prevent pregnancy after unprotected sex. They come in pill form and can be taken up to 5 days after sex. They work best the sooner they are taken after having sex. Most emergency contraceptives are available without a prescription. This method should not be used as your only form of birth control.   Barrier methods Female condom A female condom is a thin sheath that is worn over the penis during sex. Condoms keep sperm from going inside a woman's body. They can be used with a sperm-killing substance (spermicide) to increase their  effectiveness. They should be thrown away after one use. Female condom A female condom is a soft, loose-fitting sheath that is put into the vagina before sex. The condom keeps sperm from going inside a woman's body. They should be thrown away after one use. Diaphragm A diaphragm is a soft, dome-shaped barrier. It is inserted into the vagina before sex, along with a spermicide. The diaphragm blocks sperm from entering the uterus, and the spermicide kills sperm. A diaphragm should be left in the vagina for 6-8 hours after sex and removed within 24 hours. A diaphragm is prescribed and fitted by a health care provider. A diaphragm should be replaced every 1-2 years, after giving birth, after gaining more than 15 lb (6.8 kg), and after pelvic surgery. Cervical cap A cervical cap is a round, soft latex or plastic cup that fits over the cervix. It is inserted into the vagina before sex, along with spermicide. It blocks sperm from entering the uterus. The cap should be left in place for 6-8 hours after sex and removed within 48 hours. A cervical cap must be prescribed and fitted by a health care provider. It should be replaced every 2 years. Sponge A sponge is a soft, circular piece of polyurethane foam with spermicide in it. The sponge helps block sperm from entering the uterus, and the spermicide kills sperm. To use it, you make it wet and then insert it into the vagina. It should be inserted before sex, left in for at least 6 hours after sex, and removed and thrown away within 30 hours. Spermicides Spermicides are chemicals that kill or block sperm from entering the   cervix and uterus. They can come as a cream, jelly, suppository, foam, or tablet. A spermicide should be inserted into the vagina with an applicator at least 10-15 minutes before sex to allow time for it to work. The process must be repeated every time you have sex. Spermicides do not require a prescription.   Intrauterine  contraception Intrauterine device (IUD) An IUD is a T-shaped device that is put in a woman's uterus. There are two types:  Hormone IUD.This type contains progestin, a synthetic form of the hormone progesterone. This type can stay in place for 3-5 years.  Copper IUD.This type is wrapped in copper wire. It can stay in place for 10 years. Permanent methods of contraception Female tubal ligation In this method, a woman's fallopian tubes are sealed, tied, or blocked during surgery to prevent eggs from traveling to the uterus. Hysteroscopic sterilization In this method, a small, flexible insert is placed into each fallopian tube. The inserts cause scar tissue to form in the fallopian tubes and block them, so sperm cannot reach an egg. The procedure takes about 3 months to be effective. Another form of birth control must be used during those 3 months. Female sterilization This is a procedure to tie off the tubes that carry sperm (vasectomy). After the procedure, the man can still ejaculate fluid (semen). Another form of birth control must be used for 3 months after the procedure. Natural planning methods Natural family planning In this method, a couple does not have sex on days when the woman could become pregnant. Calendar method In this method, the woman keeps track of the length of each menstrual cycle, identifies the days when pregnancy can happen, and does not have sex on those days. Ovulation method In this method, a couple avoids sex during ovulation. Symptothermal method This method involves not having sex during ovulation. The woman typically checks for ovulation by watching changes in her temperature and in the consistency of cervical mucus. Post-ovulation method In this method, a couple waits to have sex until after ovulation. Where to find more information  Centers for Disease Control and Prevention: www.cdc.gov Summary  Contraception, also called birth control, refers to methods or  devices that prevent pregnancy.  Hormonal methods of contraception include implants, injections, pills, patches, vaginal rings, and emergency contraceptives.  Barrier methods of contraception can include female condoms, female condoms, diaphragms, cervical caps, sponges, and spermicides.  There are two types of IUDs (intrauterine devices). An IUD can be put in a woman's uterus to prevent pregnancy for 3-5 years.  Permanent sterilization can be done through a procedure for males and females. Natural family planning methods involve nothaving sex on days when the woman could become pregnant. This information is not intended to replace advice given to you by your health care provider. Make sure you discuss any questions you have with your health care provider. Document Revised: 10/19/2019 Document Reviewed: 10/19/2019 Elsevier Patient Education  2021 Elsevier Inc.   Breastfeeding  Choosing to breastfeed is one of the best decisions you can make for yourself and your baby. A change in hormones during pregnancy causes your breasts to make breast milk in your milk-producing glands. Hormones prevent breast milk from being released before your baby is born. They also prompt milk flow after birth. Once breastfeeding has begun, thoughts of your baby, as well as his or her sucking or crying, can stimulate the release of milk from your milk-producing glands. Benefits of breastfeeding Research shows that breastfeeding offers many health benefits   for infants and mothers. It also offers a cost-free and convenient way to feed your baby. For your baby  Your first milk (colostrum) helps your baby's digestive system to function better.  Special cells in your milk (antibodies) help your baby to fight off infections.  Breastfed babies are less likely to develop asthma, allergies, obesity, or type 2 diabetes. They are also at lower risk for sudden infant death syndrome (SIDS).  Nutrients in breast milk are better  able to meet your baby's needs compared to infant formula.  Breast milk improves your baby's brain development. For you  Breastfeeding helps to create a very special bond between you and your baby.  Breastfeeding is convenient. Breast milk costs nothing and is always available at the correct temperature.  Breastfeeding helps to burn calories. It helps you to lose the weight that you gained during pregnancy.  Breastfeeding makes your uterus return faster to its size before pregnancy. It also slows bleeding (lochia) after you give birth.  Breastfeeding helps to lower your risk of developing type 2 diabetes, osteoporosis, rheumatoid arthritis, cardiovascular disease, and breast, ovarian, uterine, and endometrial cancer later in life. Breastfeeding basics Starting breastfeeding  Find a comfortable place to sit or lie down, with your neck and back well-supported.  Place a pillow or a rolled-up blanket under your baby to bring him or her to the level of your breast (if you are seated). Nursing pillows are specially designed to help support your arms and your baby while you breastfeed.  Make sure that your baby's tummy (abdomen) is facing your abdomen.  Gently massage your breast. With your fingertips, massage from the outer edges of your breast inward toward the nipple. This encourages milk flow. If your milk flows slowly, you may need to continue this action during the feeding.  Support your breast with 4 fingers underneath and your thumb above your nipple (make the letter "C" with your hand). Make sure your fingers are well away from your nipple and your baby's mouth.  Stroke your baby's lips gently with your finger or nipple.  When your baby's mouth is open wide enough, quickly bring your baby to your breast, placing your entire nipple and as much of the areola as possible into your baby's mouth. The areola is the colored area around your nipple. ? More areola should be visible above your  baby's upper lip than below the lower lip. ? Your baby's lips should be opened and extended outward (flanged) to ensure an adequate, comfortable latch. ? Your baby's tongue should be between his or her lower gum and your breast.  Make sure that your baby's mouth is correctly positioned around your nipple (latched). Your baby's lips should create a seal on your breast and be turned out (everted).  It is common for your baby to suck about 2-3 minutes in order to start the flow of breast milk. Latching Teaching your baby how to latch onto your breast properly is very important. An improper latch can cause nipple pain, decreased milk supply, and poor weight gain in your baby. Also, if your baby is not latched onto your nipple properly, he or she may swallow some air during feeding. This can make your baby fussy. Burping your baby when you switch breasts during the feeding can help to get rid of the air. However, teaching your baby to latch on properly is still the best way to prevent fussiness from swallowing air while breastfeeding. Signs that your baby has successfully latched onto   your nipple  Silent tugging or silent sucking, without causing you pain. Infant's lips should be extended outward (flanged).  Swallowing heard between every 3-4 sucks once your milk has started to flow (after your let-down milk reflex occurs).  Muscle movement above and in front of his or her ears while sucking. Signs that your baby has not successfully latched onto your nipple  Sucking sounds or smacking sounds from your baby while breastfeeding.  Nipple pain. If you think your baby has not latched on correctly, slip your finger into the corner of your baby's mouth to break the suction and place it between your baby's gums. Attempt to start breastfeeding again. Signs of successful breastfeeding Signs from your baby  Your baby will gradually decrease the number of sucks or will completely stop sucking.  Your baby  will fall asleep.  Your baby's body will relax.  Your baby will retain a small amount of milk in his or her mouth.  Your baby will let go of your breast by himself or herself. Signs from you  Breasts that have increased in firmness, weight, and size 1-3 hours after feeding.  Breasts that are softer immediately after breastfeeding.  Increased milk volume, as well as a change in milk consistency and color by the fifth day of breastfeeding.  Nipples that are not sore, cracked, or bleeding. Signs that your baby is getting enough milk  Wetting at least 1-2 diapers during the first 24 hours after birth.  Wetting at least 5-6 diapers every 24 hours for the first week after birth. The urine should be clear or pale yellow by the age of 5 days.  Wetting 6-8 diapers every 24 hours as your baby continues to grow and develop.  At least 3 stools in a 24-hour period by the age of 5 days. The stool should be soft and yellow.  At least 3 stools in a 24-hour period by the age of 7 days. The stool should be seedy and yellow.  No loss of weight greater than 10% of birth weight during the first 3 days of life.  Average weight gain of 4-7 oz (113-198 g) per week after the age of 4 days.  Consistent daily weight gain by the age of 5 days, without weight loss after the age of 2 weeks. After a feeding, your baby may spit up a small amount of milk. This is normal. Breastfeeding frequency and duration Frequent feeding will help you make more milk and can prevent sore nipples and extremely full breasts (breast engorgement). Breastfeed when you feel the need to reduce the fullness of your breasts or when your baby shows signs of hunger. This is called "breastfeeding on demand." Signs that your baby is hungry include:  Increased alertness, activity, or restlessness.  Movement of the head from side to side.  Opening of the mouth when the corner of the mouth or cheek is stroked (rooting).  Increased  sucking sounds, smacking lips, cooing, sighing, or squeaking.  Hand-to-mouth movements and sucking on fingers or hands.  Fussing or crying. Avoid introducing a pacifier to your baby in the first 4-6 weeks after your baby is born. After this time, you may choose to use a pacifier. Research has shown that pacifier use during the first year of a baby's life decreases the risk of sudden infant death syndrome (SIDS). Allow your baby to feed on each breast as long as he or she wants. When your baby unlatches or falls asleep while feeding from the   first breast, offer the second breast. Because newborns are often sleepy in the first few weeks of life, you may need to awaken your baby to get him or her to feed. Breastfeeding times will vary from baby to baby. However, the following rules can serve as a guide to help you make sure that your baby is properly fed:  Newborns (babies 4 weeks of age or younger) may breastfeed every 1-3 hours.  Newborns should not go without breastfeeding for longer than 3 hours during the day or 5 hours during the night.  You should breastfeed your baby a minimum of 8 times in a 24-hour period. Breast milk pumping Pumping and storing breast milk allows you to make sure that your baby is exclusively fed your breast milk, even at times when you are unable to breastfeed. This is especially important if you go back to work while you are still breastfeeding, or if you are not able to be present during feedings. Your lactation consultant can help you find a method of pumping that works best for you and give you guidelines about how long it is safe to store breast milk.      Caring for your breasts while you breastfeed Nipples can become dry, cracked, and sore while breastfeeding. The following recommendations can help keep your breasts moisturized and healthy:  Avoid using soap on your nipples.  Wear a supportive bra designed especially for nursing. Avoid wearing underwire-style  bras or extremely tight bras (sports bras).  Air-dry your nipples for 3-4 minutes after each feeding.  Use only cotton bra pads to absorb leaked breast milk. Leaking of breast milk between feedings is normal.  Use lanolin on your nipples after breastfeeding. Lanolin helps to maintain your skin's normal moisture barrier. Pure lanolin is not harmful (not toxic) to your baby. You may also hand express a few drops of breast milk and gently massage that milk into your nipples and allow the milk to air-dry. In the first few weeks after giving birth, some women experience breast engorgement. Engorgement can make your breasts feel heavy, warm, and tender to the touch. Engorgement peaks within 3-5 days after you give birth. The following recommendations can help to ease engorgement:  Completely empty your breasts while breastfeeding or pumping. You may want to start by applying warm, moist heat (in the shower or with warm, water-soaked hand towels) just before feeding or pumping. This increases circulation and helps the milk flow. If your baby does not completely empty your breasts while breastfeeding, pump any extra milk after he or she is finished.  Apply ice packs to your breasts immediately after breastfeeding or pumping, unless this is too uncomfortable for you. To do this: ? Put ice in a plastic bag. ? Place a towel between your skin and the bag. ? Leave the ice on for 20 minutes, 2-3 times a day.  Make sure that your baby is latched on and positioned properly while breastfeeding. If engorgement persists after 48 hours of following these recommendations, contact your health care provider or a lactation consultant. Overall health care recommendations while breastfeeding  Eat 3 healthy meals and 3 snacks every day. Well-nourished mothers who are breastfeeding need an additional 450-500 calories a day. You can meet this requirement by increasing the amount of a balanced diet that you eat.  Drink  enough water to keep your urine pale yellow or clear.  Rest often, relax, and continue to take your prenatal vitamins to prevent fatigue, stress, and low   vitamin and mineral levels in your body (nutrient deficiencies).  Do not use any products that contain nicotine or tobacco, such as cigarettes and e-cigarettes. Your baby may be harmed by chemicals from cigarettes that pass into breast milk and exposure to secondhand smoke. If you need help quitting, ask your health care provider.  Avoid alcohol.  Do not use illegal drugs or marijuana.  Talk with your health care provider before taking any medicines. These include over-the-counter and prescription medicines as well as vitamins and herbal supplements. Some medicines that may be harmful to your baby can pass through breast milk.  It is possible to become pregnant while breastfeeding. If birth control is desired, ask your health care provider about options that will be safe while breastfeeding your baby. Where to find more information: [employer]: www.llli.org Contact a health care provider if:  You feel like you want to stop breastfeeding or have become frustrated with breastfeeding.  Your nipples are cracked or bleeding.  Your breasts are red, tender, or warm.  You have: ? Painful breasts or nipples. ? A swollen area on either breast. ? A fever or chills. ? Nausea or vomiting. ? Drainage other than breast milk from your nipples.  Your breasts do not become full before feedings by the fifth day after you give birth.  You feel sad and depressed.  Your baby is: ? Too sleepy to eat well. ? Having trouble sleeping. ? More than 1 week old and wetting fewer than 6 diapers in a 24-hour period. ? Not gaining weight by 5 days of age.  Your baby has fewer than 3 stools in a 24-hour period.  Your baby's skin or the white parts of his or her eyes become yellow. Get help right away if:  Your baby is overly tired  (lethargic) and does not want to wake up and feed.  Your baby develops an unexplained fever. Summary  Breastfeeding offers many health benefits for infant and mothers.  Try to breastfeed your infant when he or she shows early signs of hunger.  Gently tickle or stroke your baby's lips with your finger or nipple to allow the baby to open his or her mouth. Bring the baby to your breast. Make sure that much of the areola is in your baby's mouth. Offer one side and burp the baby before you offer the other side.  Talk with your health care provider or lactation consultant if you have questions or you face problems as you breastfeed. This information is not intended to replace advice given to you by your health care provider. Make sure you discuss any questions you have with your health care provider. Document Revised: 08/08/2017 Document Reviewed: 06/15/2016 Elsevier Patient Education  2021 Elsevier Inc.  

## 2020-11-11 ENCOUNTER — Ambulatory Visit (INDEPENDENT_AMBULATORY_CARE_PROVIDER_SITE_OTHER): Payer: No Typology Code available for payment source | Admitting: Family Medicine

## 2020-11-11 ENCOUNTER — Encounter: Payer: Self-pay | Admitting: Family Medicine

## 2020-11-11 ENCOUNTER — Other Ambulatory Visit (HOSPITAL_COMMUNITY)
Admission: RE | Admit: 2020-11-11 | Discharge: 2020-11-11 | Disposition: A | Discharge status: Home / Self Care | Payer: No Typology Code available for payment source | Source: Ambulatory Visit | Attending: Family Medicine | Admitting: Family Medicine

## 2020-11-11 ENCOUNTER — Other Ambulatory Visit: Payer: Self-pay

## 2020-11-11 VITALS — BP 112/76 | HR 83 | Wt 214.7 lb

## 2020-11-11 DIAGNOSIS — O09521 Supervision of elderly multigravida, first trimester: Secondary | ICD-10-CM

## 2020-11-11 DIAGNOSIS — O0990 Supervision of high risk pregnancy, unspecified, unspecified trimester: Secondary | ICD-10-CM

## 2020-11-11 NOTE — Progress Notes (Signed)
   Subjective:  Bianca Hodge is a 40 y.o. G3P1011 at [redacted]w[redacted]d being seen today for ongoing prenatal care.  She is currently monitored for the following issues for this low-risk pregnancy and has Irritable bowel syndrome (IBS); Supervision of high risk pregnancy, antepartum; AMA (advanced maternal age) multigravida 35+, first trimester; and Size > dates on their problem list.  Patient reports no complaints.  Contractions: Irritability. Vag. Bleeding: None.  Movement: Present. Denies leaking of fluid.   The following portions of the patient's history were reviewed and updated as appropriate: allergies, current medications, past family history, past medical history, past social history, past surgical history and problem list. Problem list updated.  Objective:   Vitals:   11/11/20 0830  BP: 112/76  Pulse: 83  Weight: 214 lb 11.2 oz (97.4 kg)    Fetal Status: Fetal Heart Rate (bpm): 140   Movement: Present     General:  Alert, oriented and cooperative. Patient is in no acute distress.  Skin: Skin is warm and dry. No rash noted.   Cardiovascular: Normal heart rate noted  Respiratory: Normal respiratory effort, no problems with respiration noted  Abdomen: Soft, gravid, appropriate for gestational age. Pain/Pressure: Present     Pelvic: Vag. Bleeding: None     Cervical exam deferred        Extremities: Normal range of motion.  Edema: Trace  Mental Status: Normal mood and affect. Normal behavior. Normal judgment and thought content.   Urinalysis:      Assessment and Plan:  Pregnancy: G3P1011 at [redacted]w[redacted]d  1. Supervision of high risk pregnancy, antepartum BP and FHR normal Swabs today Confirmed cephalic by [place] - GC/Chlamydia probe amp ([place])not at [facility] - Culture, beta strep (group b only)  2. AMA (advanced maternal age) multigravida 35+, first trimester   Preterm labor symptoms and general obstetric precautions including but not limited to vaginal bleeding, contractions, leaking  of fluid and fetal movement were reviewed in detail with the patient. Please refer to After Visit Summary for other counseling recommendations.  Return in 1 week (on 11/18/2020) for ob visit.   Venora Maples, MD

## 2020-11-11 NOTE — Patient Instructions (Signed)

## 2020-11-15 LAB — GC/CHLAMYDIA PROBE AMP (~~LOC~~) NOT AT ARMC
Chlamydia: NEGATIVE
Comment: NEGATIVE
Comment: NORMAL
Neisseria Gonorrhea: NEGATIVE

## 2020-11-15 LAB — CULTURE, BETA STREP (GROUP B ONLY): Strep Gp B Culture: NEGATIVE

## 2020-11-17 ENCOUNTER — Encounter: Payer: No Typology Code available for payment source | Admitting: Family Medicine

## 2020-11-23 ENCOUNTER — Telehealth (INDEPENDENT_AMBULATORY_CARE_PROVIDER_SITE_OTHER): Payer: No Typology Code available for payment source | Admitting: Certified Nurse Midwife

## 2020-11-23 VITALS — BP 144/91 | HR 70 | Wt 217.4 lb

## 2020-11-23 DIAGNOSIS — Z3A38 38 weeks gestation of pregnancy: Secondary | ICD-10-CM

## 2020-11-23 DIAGNOSIS — Z3493 Encounter for supervision of normal pregnancy, unspecified, third trimester: Secondary | ICD-10-CM

## 2020-11-23 NOTE — Progress Notes (Signed)
I connected with  Bianca Hodge on 11/23/20 at 10:35 AM EDT by MyChart Virtual Video Visit and verified that I am speaking with the correct person using two identifiers.   I discussed the limitations, risks, security and privacy concerns of performing an evaluation and management service by telephone and the availability of in person appointments. I also discussed with the patient that there may be a patient responsible charge related to this service. The patient expressed understanding and agreed to proceed.  Guy Begin, CMA 11/23/2020  11:00 AM

## 2020-11-23 NOTE — Progress Notes (Signed)
Patient stated that she tested positive for Covid-19 on June 25, 22 with symptoms starting on the 24th. Patient has been taking Mucinex. She wants to know if mucinex will dry up milk supply.

## 2020-11-24 ENCOUNTER — Telehealth: Payer: Self-pay | Admitting: *Deleted

## 2020-11-24 NOTE — Telephone Encounter (Signed)
Call received from [employer] representative stating that pt had triggered an elevated BP of 144/91 a few minutes ago. Pt was also reporting +HA and SOB. Per chart review, pt also had elevated BP yesterday of 144/91 and has Hx of +Covid test on 6/25. Consult w/Jamilla Dan Humphreys, CNM who advises pt needs to go to MAU for evaluation. I called pt and discussed the concern of elevated BP. She stated that she was entering her BP result from yesterday to [employer] and that today her BP is 123/81. She also stated that she does not have +HA or SOB today - those were sx from yesterday only. Pt was advised to continue to monitor BP as instructed and to notify our office of abnormal values or go to MAU if the office is closed. Pt voiced understanding.

## 2020-11-24 NOTE — Progress Notes (Signed)
   OBSTETRICS PRENATAL VIRTUAL VISIT ENCOUNTER NOTE  Provider location: [facility] for [facility]   Patient location: Home  I connected with Bianca Hodge on 11/24/20 at 10:35 AM EDT by MyChart Video Encounter and verified that I am speaking with the correct person using two identifiers. I discussed the limitations, risks, security and privacy concerns of performing an evaluation and management service virtually and the availability of in person appointments. I also discussed with the patient that there may be a patient responsible charge related to this service. The patient expressed understanding and agreed to proceed. Subjective:  Bianca Hodge is a 40 y.o. G3P1011 at [redacted]w[redacted]d being seen today for ongoing prenatal care.  She is currently monitored for the following issues for this low-risk pregnancy and has Irritable bowel syndrome (IBS); Supervision of high risk pregnancy, antepartum; AMA (advanced maternal age) multigravida 35+, first trimester; and Size > dates on their problem list.  Patient reports  active Covid infection involving a sore throat, cough, congestion and occasional headache. She has been taking Mucinex but is concerned about it drying up her mild and wants to know what else she can take safely in the last few weeks of pregnancy .  Contractions: Not present. Vag. Bleeding: None.  Movement: Present. Denies any leaking of fluid.   The following portions of the patient's history were reviewed and updated as appropriate: allergies, current medications, past family history, past medical history, past social history, past surgical history and problem list.   Objective:   Vitals:   11/23/20 1104  BP: (!) 144/91  Pulse: 70  Weight: 217 lb 6.4 oz (98.6 kg)    Fetal Status:     Movement: Present     General:  Alert, oriented and cooperative. Patient is in no acute distress.  Respiratory: Normal respiratory effort, no problems with respiration noted   Mental Status: Normal mood and affect. Normal behavior. Normal judgment and thought content.  Rest of physical exam deferred due to type of encounter  Imaging: No results found.  Assessment and Plan:  Pregnancy: G3P1011 at [redacted]w[redacted]d 1. Supervision of low-risk pregnancy, third trimester - Doing well aside from Covid, feeling regular and vigorous fetal movement - Reviewed safe meds for URI in pregnancy, encouraged to keep taking Mucinex, use Tylenol for pain, nasal saline spray and Cepacol for her sore throat.  2. [redacted] weeks gestation of pregnancy - Routine OB care  Term labor symptoms and general obstetric precautions including but not limited to vaginal bleeding, contractions, leaking of fluid and fetal movement were reviewed in detail with the patient. I discussed the assessment and treatment plan with the patient. The patient was provided an opportunity to ask questions and all were answered. The patient agreed with the plan and demonstrated an understanding of the instructions. The patient was advised to call back or seek an in-person office evaluation/go to MAU at [facility] for any urgent or concerning symptoms. Please refer to After Visit Summary for other counseling recommendations.   I provided 15 minutes of face-to-face time during this encounter.  Return in about 1 week (around 11/30/2020) for IN-PERSON, LOB.  No future appointments.  Bernerd Limbo, CNM [facility] for [employer], [facility]

## 2020-11-25 ENCOUNTER — Encounter: Payer: No Typology Code available for payment source | Admitting: Family Medicine

## 2020-11-30 ENCOUNTER — Inpatient Hospital Stay (HOSPITAL_COMMUNITY): Payer: No Typology Code available for payment source | Admitting: Anesthesiology

## 2020-11-30 ENCOUNTER — Encounter (HOSPITAL_COMMUNITY): Payer: Self-pay | Admitting: Obstetrics & Gynecology

## 2020-11-30 ENCOUNTER — Inpatient Hospital Stay (HOSPITAL_COMMUNITY)
Admission: AD | Admit: 2020-11-30 | Discharge: 2020-12-01 | DRG: 805 | Disposition: A | Discharge status: Home / Self Care | Payer: No Typology Code available for payment source | Attending: Family Medicine | Admitting: Family Medicine

## 2020-11-30 DIAGNOSIS — O9852 Other viral diseases complicating childbirth: Secondary | ICD-10-CM | POA: Diagnosis present

## 2020-11-30 DIAGNOSIS — R030 Elevated blood-pressure reading, without diagnosis of hypertension: Secondary | ICD-10-CM | POA: Diagnosis present

## 2020-11-30 DIAGNOSIS — Z3A39 39 weeks gestation of pregnancy: Secondary | ICD-10-CM

## 2020-11-30 DIAGNOSIS — O26893 Other specified pregnancy related conditions, third trimester: Principal | ICD-10-CM | POA: Diagnosis present

## 2020-11-30 DIAGNOSIS — U071 COVID-19: Secondary | ICD-10-CM | POA: Diagnosis present

## 2020-11-30 DIAGNOSIS — O134 Gestational [pregnancy-induced] hypertension without significant proteinuria, complicating childbirth: Secondary | ICD-10-CM

## 2020-11-30 LAB — COMPREHENSIVE METABOLIC PANEL
ALT: 34 U/L (ref 0–44)
ALT: 24 U/L (ref 0–44)
AST: 41 U/L (ref 15–41)
AST: 21 U/L (ref 15–41)
Albumin: 2.9 g/dL — ABNORMAL LOW (ref 3.5–5.0)
Albumin: 2.3 g/dL — ABNORMAL LOW (ref 3.5–5.0)
Alkaline Phosphatase: 138 U/L — ABNORMAL HIGH (ref 38–126)
Alkaline Phosphatase: 112 U/L (ref 38–126)
Anion gap: 10 (ref 5–15)
Anion gap: 6 (ref 5–15)
BUN: 7 mg/dL (ref 6–20)
BUN: 7 mg/dL (ref 6–20)
CO2: 17 mmol/L — ABNORMAL LOW (ref 22–32)
CO2: 21 mmol/L — ABNORMAL LOW (ref 22–32)
Calcium: 9.3 mg/dL (ref 8.9–10.3)
Calcium: 8.5 mg/dL — ABNORMAL LOW (ref 8.9–10.3)
Chloride: 106 mmol/L (ref 98–111)
Chloride: 107 mmol/L (ref 98–111)
Creatinine, Ser: 0.5 mg/dL (ref 0.44–1.00)
Creatinine, Ser: 0.57 mg/dL (ref 0.44–1.00)
GFR, Estimated: >60 mL/min (ref >60)
GFR, Estimated: >60 mL/min (ref >60)
Glucose, Bld: 77 mg/dL (ref 70–99)
Glucose, Bld: 134 mg/dL — ABNORMAL HIGH (ref 70–99)
Potassium: 4.6 mmol/L (ref 3.5–5.1)
Potassium: 3.6 mmol/L (ref 3.5–5.1)
Sodium: 133 mmol/L — ABNORMAL LOW (ref 135–145)
Sodium: 134 mmol/L — ABNORMAL LOW (ref 135–145)
Total Bilirubin: 0.5 mg/dL (ref 0.3–1.2)
Total Bilirubin: 0.7 mg/dL (ref 0.3–1.2)
Total Protein: 6.4 g/dL — ABNORMAL LOW (ref 6.5–8.1)
Total Protein: 5.3 g/dL — ABNORMAL LOW (ref 6.5–8.1)

## 2020-11-30 LAB — CBC
HCT: 32.9 % — ABNORMAL LOW (ref 36.0–46.0)
Hemoglobin: 10.4 g/dL — ABNORMAL LOW (ref 12.0–15.0)
MCH: 25.4 pg — ABNORMAL LOW (ref 26.0–34.0)
MCHC: 31.6 g/dL (ref 30.0–36.0)
MCV: 80.2 fL (ref 80.0–100.0)
Platelets: 326 10*3/uL (ref 150–400)
RBC: 4.1 MIL/uL (ref 3.87–5.11)
RDW: 14.5 % (ref 11.5–15.5)
WBC: 10.2 10*3/uL (ref 4.0–10.5)
nRBC: 0 % (ref 0.0–0.2)

## 2020-11-30 LAB — PROTEIN / CREATININE RATIO, URINE
Creatinine, Urine: 49.09 mg/dL
Total Protein, Urine: <6 mg/dL

## 2020-11-30 LAB — RESP PANEL BY RT-PCR (FLU A&B, COVID) ARPGX2
Influenza A by PCR: NEGATIVE
Influenza B by PCR: NEGATIVE
SARS Coronavirus 2 by RT PCR: POSITIVE — AB

## 2020-11-30 LAB — TYPE AND SCREEN
ABO/RH(D): O POS
Antibody Screen: NEGATIVE

## 2020-11-30 LAB — RPR: RPR Ser Ql: NONREACTIVE

## 2020-11-30 MED ORDER — ZOLPIDEM TARTRATE 5 MG PO TABS: 5.0000 mg | ORAL_TABLET | Freq: Every evening | ORAL | Status: DC | PRN

## 2020-11-30 MED ORDER — FENTANYL-BUPIVACAINE-NACL 0.5-0.125-0.9 MG/250ML-% EP SOLN
12.0000 mL/h | EPIDURAL | Status: DC | PRN
Start: 2020-11-30 — End: 2020-11-30
  Administered 2020-11-30: 12 mL/h via EPIDURAL
  Filled 2020-11-30: qty 250

## 2020-11-30 MED ORDER — EPHEDRINE 5 MG/ML INJ
10.0000 mg | INTRAVENOUS | Status: DC | PRN
  Filled 2020-11-30: qty 10

## 2020-11-30 MED ORDER — PHENYLEPHRINE 40 MCG/ML (10ML) SYRINGE FOR IV PUSH (FOR BLOOD PRESSURE SUPPORT)
80.0000 ug | PREFILLED_SYRINGE | INTRAVENOUS | Status: DC | PRN
  Filled 2020-11-30: qty 10

## 2020-11-30 MED ORDER — DIPHENHYDRAMINE HCL 50 MG/ML IJ SOLN: 12.5000 mg | INTRAMUSCULAR | Status: DC | PRN

## 2020-11-30 MED ORDER — SIMETHICONE 80 MG PO CHEW: 80.0000 mg | CHEWABLE_TABLET | ORAL | Status: DC | PRN

## 2020-11-30 MED ORDER — DIBUCAINE (PERIANAL) 1 % EX OINT: 1.0000 "application " | TOPICAL_OINTMENT | CUTANEOUS | Status: DC | PRN

## 2020-11-30 MED ORDER — DIPHENHYDRAMINE HCL 25 MG PO CAPS: 25.0000 mg | ORAL_CAPSULE | Freq: Four times a day (QID) | ORAL | Status: DC | PRN

## 2020-11-30 MED ORDER — PRENATAL MULTIVITAMIN CH
1.0000 | ORAL_TABLET | Freq: Every day | ORAL | Status: DC
  Administered 2020-12-01: 1 via ORAL
  Filled 2020-11-30: qty 1

## 2020-11-30 MED ORDER — LIDOCAINE HCL (PF) 1 % IJ SOLN: 30.0000 mL | INTRAMUSCULAR | Status: DC | PRN

## 2020-11-30 MED ORDER — COCONUT OIL OIL: 1.0000 "application " | TOPICAL_OIL | Status: DC | PRN

## 2020-11-30 MED ORDER — ACETAMINOPHEN 325 MG PO TABS: 650.0000 mg | ORAL_TABLET | ORAL | Status: DC | PRN

## 2020-11-30 MED ORDER — ACETAMINOPHEN 325 MG PO TABS
650.0000 mg | ORAL_TABLET | ORAL | Status: DC | PRN
  Administered 2020-12-01: 650 mg via ORAL
  Filled 2020-11-30: qty 2

## 2020-11-30 MED ORDER — BENZOCAINE-MENTHOL 20-0.5 % EX AERO
1.0000 "application " | INHALATION_SPRAY | CUTANEOUS | Status: DC | PRN
  Administered 2020-11-30: 1 via TOPICAL
  Filled 2020-11-30: qty 56

## 2020-11-30 MED ORDER — TERBUTALINE SULFATE 1 MG/ML IJ SOLN: 0.2500 mg | Freq: Once | INTRAMUSCULAR | Status: DC | PRN

## 2020-11-30 MED ORDER — SOD CITRATE-CITRIC ACID 500-334 MG/5ML PO SOLN: 30.0000 mL | ORAL | Status: DC | PRN

## 2020-11-30 MED ORDER — LACTATED RINGERS IV SOLN
500.0000 mL | Freq: Once | INTRAVENOUS | Status: AC
  Administered 2020-11-30: 500 mL via INTRAVENOUS

## 2020-11-30 MED ORDER — EPHEDRINE 5 MG/ML INJ
10.0000 mg | INTRAVENOUS | Status: DC | PRN
  Administered 2020-11-30: 10 mg via INTRAVENOUS

## 2020-11-30 MED ORDER — ONDANSETRON HCL 4 MG/2ML IJ SOLN: 4.0000 mg | Freq: Four times a day (QID) | INTRAMUSCULAR | Status: DC | PRN

## 2020-11-30 MED ORDER — SENNOSIDES-DOCUSATE SODIUM 8.6-50 MG PO TABS
2.0000 | ORAL_TABLET | Freq: Every day | ORAL | Status: DC
  Administered 2020-12-01: 2 via ORAL
  Filled 2020-11-30: qty 2

## 2020-11-30 MED ORDER — LACTATED RINGERS IV SOLN: INTRAVENOUS | Status: DC

## 2020-11-30 MED ORDER — LIDOCAINE-EPINEPHRINE (PF) 2 %-1:200000 IJ SOLN
INTRAMUSCULAR | Status: DC | PRN
  Administered 2020-11-30: 5 mL via EPIDURAL

## 2020-11-30 MED ORDER — TETANUS-DIPHTH-ACELL PERTUSSIS 5-2.5-18.5 LF-MCG/0.5 IM SUSY: 0.5000 mL | PREFILLED_SYRINGE | Freq: Once | INTRAMUSCULAR | Status: DC

## 2020-11-30 MED ORDER — IBUPROFEN 600 MG PO TABS
600.0000 mg | ORAL_TABLET | Freq: Four times a day (QID) | ORAL | Status: DC
  Administered 2020-11-30 – 2020-12-01 (×4): 600 mg via ORAL
  Filled 2020-11-30 (×4): qty 1

## 2020-11-30 MED ORDER — FENTANYL CITRATE (PF) 100 MCG/2ML IJ SOLN: 50.0000 ug | INTRAMUSCULAR | Status: DC | PRN

## 2020-11-30 MED ORDER — OXYTOCIN BOLUS FROM INFUSION
333.0000 mL | Freq: Once | INTRAVENOUS | Status: AC
  Administered 2020-11-30: 333 mL via INTRAVENOUS

## 2020-11-30 MED ORDER — OXYTOCIN-SODIUM CHLORIDE 30-0.9 UT/500ML-% IV SOLN: 2.5000 [IU]/h | INTRAVENOUS | Status: DC

## 2020-11-30 MED ORDER — ONDANSETRON HCL 4 MG/2ML IJ SOLN: 4.0000 mg | INTRAMUSCULAR | Status: DC | PRN

## 2020-11-30 MED ORDER — LIDOCAINE HCL (PF) 1 % IJ SOLN
INTRAMUSCULAR | Status: DC | PRN
  Administered 2020-11-30 (×2): 5 mL via EPIDURAL

## 2020-11-30 MED ORDER — WITCH HAZEL-GLYCERIN EX PADS: 1.0000 "application " | MEDICATED_PAD | CUTANEOUS | Status: DC | PRN

## 2020-11-30 MED ORDER — PHENYLEPHRINE 40 MCG/ML (10ML) SYRINGE FOR IV PUSH (FOR BLOOD PRESSURE SUPPORT)
80.0000 ug | PREFILLED_SYRINGE | INTRAVENOUS | Status: DC | PRN
  Administered 2020-11-30: 80 ug via INTRAVENOUS

## 2020-11-30 MED ORDER — ONDANSETRON HCL 4 MG PO TABS: 4.0000 mg | ORAL_TABLET | ORAL | Status: DC | PRN

## 2020-11-30 MED ORDER — LACTATED RINGERS IV SOLN
500.0000 mL | INTRAVENOUS | Status: DC | PRN
  Administered 2020-11-30 (×2): 500 mL via INTRAVENOUS

## 2020-11-30 MED ORDER — OXYTOCIN-SODIUM CHLORIDE 30-0.9 UT/500ML-% IV SOLN
1.0000 m[IU]/min | INTRAVENOUS | Status: DC
  Administered 2020-11-30: 2 m[IU]/min via INTRAVENOUS
  Filled 2020-11-30: qty 500

## 2020-11-30 NOTE — Anesthesia Procedure Notes (Signed)
Epidural Patient location during procedure: OB Start time: 11/30/2020 3:26 AM End time: 11/30/2020 3:42 AM  Staffing Anesthesiologist: Heather Roberts, MD Performed: anesthesiologist   Preanesthetic Checklist Completed: patient identified, IV checked, site marked, risks and benefits discussed, monitors and equipment checked, pre-op evaluation and timeout performed  Epidural Patient position: sitting Prep: DuraPrep Patient monitoring: heart rate, cardiac monitor, continuous pulse ox and blood pressure Approach: midline Location: L2-L3 Injection technique: LOR saline  Needle:  Needle type: Tuohy  Needle gauge: 17 G Needle length: 9 cm Needle insertion depth: 6 cm Catheter size: 20 Guage Catheter at skin depth: 11 cm Test dose: negative and Other  Assessment Events: blood not aspirated, injection not painful, no injection resistance and negative IV test  Additional Notes Informed consent obtained prior to proceeding including risk of failure, 1% risk of PDPH, risk of minor discomfort and bruising.  Discussed rare but serious complications including epidural abscess, permanent nerve injury, epidural hematoma.  Discussed alternatives to epidural analgesia and patient desires to proceed.  Timeout performed pre-procedure verifying patient name, procedure, and platelet count.  Patient tolerated procedure well.

## 2020-11-30 NOTE — Lactation Note (Signed)
This note was copied from a baby's chart. Lactation Consultation Note  Patient Name: Bianca Hodge GSUPJ'S Date: 11/30/2020 Age:40 hours  Family is sleeping upon visit. Mother requests LC to come back for next feeding around 2300.       Walker Paddack A Higuera Ancidey 11/30/2020, 10:27 PM

## 2020-11-30 NOTE — MAU Note (Signed)
Ctx started at 0830 last night, denies LOF and VB, reports +FM.

## 2020-11-30 NOTE — Progress Notes (Signed)
Bianca Hodge is a 40 y.o. G3P1011 at [redacted]w[redacted]d by LMP admitted for active labor, BP elevated in labor only.  Subjective: Pt feeling rectal pressure, husband at bedside for support  Objective: BP 131/68   Pulse 87   Temp 97.9 F (36.6 C) (Oral)   Resp 20   LMP 02/29/2020   SpO2 100%  No intake/output data recorded. Total I/O In: -  Out: 500 [Urine:500]  FHT:  FHR: 145 bpm, variability: moderate,  accelerations:  Present,  decelerations:  Absent UC:   regular, every 2 minutes SVE:   Dilation: 10 Effacement (%): 100 Station: Plus 2 Exam by:: Misty Stanley, CNM  Labs: Lab Results  Component Value Date   WBC 10.2 11/30/2020   HGB 10.4 (L) 11/30/2020   HCT 32.9 (L) 11/30/2020   MCV 80.2 11/30/2020   PLT 326 11/30/2020    Assessment / Plan: Spontaneous labor progressing normally  Labor:  Pt pushed x 2 contractions with no descent. Pt feeling pelvic pressure but not urge to push.  Plan to labor down x 30 minutes or until urge to push. Pt sidelying with peanut ball at this time, encouraged to change positions often to facilitate labor progress.  Preeclampsia:  labs stable Fetal Wellbeing:  Category I Pain Control:  Epidural I/D:   GBS neg Anticipated MOD:  NSVD  Sharen Counter 11/30/2020, 10:21 AM

## 2020-11-30 NOTE — Discharge Summary (Signed)
Postpartum Discharge Summary     Patient Name: Bianca Hodge DOB: 03/06/1981 MRN: [id number]  Date of admission: 11/30/2020 Delivery date:11/30/2020  Delivering provider: Fatima Blank A  Date of discharge: 12/01/2020  Admitting diagnosis: Normal labor [O80, Z37.9] Intrauterine pregnancy: [redacted]w[redacted]d    Secondary diagnosis:  Active Problems:   Normal labor   NSVD (normal spontaneous vaginal delivery)  Additional problems: none    Discharge diagnosis: Term Pregnancy Delivered                                              Post partum procedures: none Augmentation: Pitocin Complications: None  Hospital course: Onset of Labor With Vaginal Delivery      40y.o. yo [id number] 335w2das admitted in Active Labor on 11/30/2020. Patient had an uncomplicated labor course, however a couple of her BP values were mildly elevated with neg bloodwork.  Membrane Rupture Time/Date: 8:53 AM ,11/30/2020   Delivery Method:Vaginal, Spontaneous  Episiotomy: None  Lacerations:  2nd degree;Perineal  Patient had an uncomplicated postpartum course. She remained normotensive and medication was not started. She is ambulating, tolerating a regular diet, passing flatus, and urinating well. Patient is discharged home in stable condition on 12/01/20 per her request for early d/c, as long as the baby can go as well.  Newborn Data: Birth date:11/30/2020  Birth time:1:00 PM  Gender:Female  Living status:Living  Apgars:8 ,9  Weight:3569 g (7lb 13.9oz)  Magnesium Sulfate received: No BMZ received: No Rhophylac:No MMR:No T-DaP:Given prenatally Flu: Yes Transfusion:No  Physical exam  Vitals:   11/30/20 1630 11/30/20 2021 12/01/20 0000 12/01/20 0503  BP: 127/71 137/67 125/67 122/65  Pulse: 74 64 63 61  Resp: 16 16 18 16   Temp: 98 F (36.7 C) 97.9 F (36.6 C) 98 F (36.7 C) 98 F (36.7 C)  TempSrc: Oral Oral Oral Oral  SpO2: 99% 100% 98% 100%   General: alert and cooperative Lochia: appropriate Uterine  Fundus: firm Incision: N/A DVT Evaluation: No evidence of DVT seen on physical exam. Labs: Lab Results  Component Value Date   WBC 10.2 11/30/2020   HGB 10.4 (L) 11/30/2020   HCT 32.9 (L) 11/30/2020   MCV 80.2 11/30/2020   PLT 326 11/30/2020   CMP Latest Ref Rng & Units 11/30/2020  Glucose 70 - 99 mg/dL 134(H)  BUN 6 - 20 mg/dL 7  Creatinine 0.44 - 1.00 mg/dL 0.57  Sodium 135 - 145 mmol/L 134(L)  Potassium 3.5 - 5.1 mmol/L 3.6  Chloride 98 - 111 mmol/L 107  CO2 22 - 32 mmol/L 21(L)  Calcium 8.9 - 10.3 mg/dL 8.5(L)  Total Protein 6.5 - 8.1 g/dL 5.3(L)  Total Bilirubin 0.3 - 1.2 mg/dL 0.7  Alkaline Phos 38 - 126 U/L 112  AST 15 - 41 U/L 21  ALT 0 - 44 U/L 24   Edinburgh Score: No flowsheet data found.   After visit meds:  Allergies as of 12/01/2020       Reactions   Bee Venom Swelling   Facial swelling   Gluten Meal         Medication List     TAKE these medications    ALIGN PREBIOTIC-PROBIOTIC PO Take 1 tablet by mouth daily.   ibuprofen 600 MG tablet Commonly known as: ADVIL Take 1 tablet (600 mg total) by mouth every 6 (six) hours as needed.  Prenatal 1 30-0.975-200 MG Caps Take 1 tablet by mouth daily.         Discharge home in stable condition Infant Feeding: Breast Infant Disposition:home with mother Discharge instruction: per After Visit Summary and Postpartum booklet. Activity: Advance as tolerated. Pelvic rest for 6 weeks.  Diet: routine diet Future Appointments:No future appointments. Follow up Visit:  Message sent to [facility] on 11/30/20: Please schedule this patient for a In person postpartum visit in 4 weeks with the following provider:  midwife preferred . Additional Postpartum F/U:BP check 1 week  Low risk pregnancy complicated by:  HTN in labor Delivery mode:  Vaginal, Spontaneous  Anticipated Birth Control:  Condoms   12/01/2020 Myrtis Ser, CNM 7:11 AM

## 2020-11-30 NOTE — H&P (Addendum)
OBSTETRIC ADMISSION HISTORY AND PHYSICAL  Bianca Hodge is a 40 y.o. female G3P1011 with IUP at [redacted]w[redacted]d by LMP presenting for labor. She reports +FMs, No LOF, no VB, no blurry vision, headaches or peripheral edema, and RUQ pain.  She plans on breast feeding. She request condoms for birth control. She [facility]   Dating: By LMP --->  Estimated Date of Delivery: 12/05/20  Sono:    @[redacted]w[redacted]d , normal anatomy, transverse presentation, 352g, 93% EFW   Prenatal History/Complications:  -Covid in third trimester (tested Positive June 25), has recovered and reports asymptomatic  -S>D, declined growth June 27  -AMA   Past Medical History: Past Medical History:  Diagnosis Date   Parasite infection 06-04-2014    Past Surgical History: Past Surgical History:  Procedure Laterality Date   APPENDECTOMY      Obstetrical History: OB History     Gravida  3   Para  1   Term  1   Preterm  0   AB  1   Living  1      SAB  0   IAB  1   Ectopic  0   Multiple  0   Live Births  1           Social History Social History   Socioeconomic History   Marital status: Married    Spouse name: Not on file   Number of children: Not on file   Years of education: Not on file   Highest education level: Not on file  Occupational History   Not on file  Tobacco Use   Smoking status: Never   Smokeless tobacco: Never  Substance and Sexual Activity   Alcohol use: No   Drug use: Not Currently   Sexual activity: Yes    Birth control/protection: None  Other Topics Concern   Not on file  Social History Narrative   Not on file   Social Determinants of Health   Financial Resource Strain: Not on file  Food Insecurity: No Food Insecurity   Worried About Running Out of Food in the Last Year: Never true   Ran Out of Food in the Last Year: Never true  Transportation Needs: No Transportation Needs   Lack of Transportation (Medical): No   Lack of Transportation  (Non-Medical): No  Physical Activity: Not on file  Stress: Not on file  Social Connections: Not on file    Family History: No family history on file.  Allergies: Allergies  Allergen Reactions   Bee Venom    Gluten Meal     Medications Prior to Admission  Medication Sig Dispense Refill Last Dose   Bacillus Coagulans-Inulin (ALIGN PREBIOTIC-PROBIOTIC PO) Take 1 tablet by mouth daily.   11/30/2020   Prenatal MV-Min-Fe Fum-FA-DHA (PRENATAL 1) 30-0.975-200 MG CAPS Take 1 tablet by mouth daily.    11/30/2020     Review of Systems   All systems reviewed and negative except as stated in HPI  Blood pressure (!) 147/84, pulse 74, temperature 97.7 F (36.5 C), temperature source Oral, resp. rate 18, last menstrual period 02/29/2020, SpO2 100 %, unknown if currently breastfeeding. General appearance: alert, cooperative, and appears stated age Lungs: clear to auscultation bilaterally Heart: regular rate and rhythm Abdomen: soft, gravid Presentation: cephalic Fetal monitoring baseline 145, mod to min variability, pos accels, few late decels after epidural placement, improved w phenylephrine and fluid bolus  Uterine activity: q2-3 min  Dilation: 5 Effacement (%): 80 Station: Ballotable Exam by::  Terrall Laity, RN   Prenatal labs: ABO, Rh: O/Positive/-- (12/29 1557) Antibody: Negative (12/29 1557) Rubella: 2.55 (12/29 1557) RPR: Non Reactive (04/22 0842)  HBsAg: Negative (12/29 1557)  HIV: Non Reactive (04/22 0842)  GBS: Negative/-- (06/17 0933)  2 hr Glucola normal Genetic screening  normal female  Anatomy US normal   Prenatal Transfer Tool  Maternal Diabetes: No Genetic Screening: Normal Maternal Ultrasounds/Referrals: Declined Fetal Ultrasounds or other Referrals:  None Maternal Substance Abuse:  No Significant Maternal Medications:  None Significant Maternal Lab Results: Group B Strep negative  No results found for this or any previous visit (from the past 24  hour(s)).  Patient Active Problem List   Diagnosis Date Noted   Size > dates 08/18/2020   AMA (advanced maternal age) multigravida 35+, first trimester 05/26/2020   Supervision of high risk pregnancy, antepartum 05/25/2020   Irritable bowel syndrome (IBS) 04/04/2015    Assessment/Plan:  Bianca Hodge is a 40 y.o. G3P1011 at [redacted]w[redacted]d here for labor.  #Labor: Expectant mgmt, can augment prn  #Elevated BP on arrival: Has had a single other elevated BP prior to this. PreE labs ordered and urine P:C. Continue to monitor closely,. Technically meets criteria at least for gHTN.  #Covid Pos: Reports first tested positive on June 25th. Confirmed with photo. Asymptomatic and out of quarantine window. Discussed with charge and okay to remove contact precautions.   #Pain: Epidural placed on arrival to L&D with subsequent intermittent late decels, improved w fluid bolus and phenylephrine #FWB: Cat II after epidural, but now improving. Will monitor  #ID:  Gbs neg  #MOF: breast #MOC:condoms #Circ:  No   Gita Kudo, MD  11/30/2020, 1:57 AM

## 2020-11-30 NOTE — Lactation Note (Signed)
This note was copied from a baby's chart. Lactation Consultation Note  Patient Name: Boy Khari Lett OEVOJ'J Date: 11/30/2020 Reason for consult: L&D Initial assessment Age:40 Hour P2, Mother reports breastfeeding her first child for 3 yrs. She was breastfeeding infant when I arrived to the room. Mother reports that infant latched on right away.  Mother denies having any pain. She was taught how to hand express colostrum and reviewed basic. Encouraged to do STS. Mother was informed that she would have support when she gets to the room .  Maternal Data    Feeding Mother's Current Feeding Choice: Breast Milk  LATCH Score Latch: Grasps breast easily, tongue down, lips flanged, rhythmical sucking.  Audible Swallowing: Spontaneous and intermittent  Type of Nipple: Everted at rest and after stimulation  Comfort (Breast/Nipple): Soft / non-tender  Hold (Positioning): No assistance needed to correctly position infant at breast.  LATCH Score: 10   Lactation Tools Discussed/Used    Interventions    Discharge    Consult Status Consult Status: Follow-up Date: 11/30/20 Follow-up type: In-patient    Stevan Born [facility] 11/30/2020, 4:04 PM

## 2020-11-30 NOTE — Anesthesia Preprocedure Evaluation (Signed)
Anesthesia Evaluation  Patient identified by MRN, date of birth, ID band Patient awake    Reviewed: Allergy & Precautions, NPO status , Patient's Chart, lab work & pertinent test results  History of Anesthesia Complications Negative for: history of anesthetic complications  Airway Mallampati: III  TM Distance: >3 FB Neck ROM: Full    Dental no notable dental hx. (+) Dental Advisory Given   Pulmonary neg pulmonary ROS,    Pulmonary exam normal        Cardiovascular negative cardio ROS Normal cardiovascular exam     Neuro/Psych negative neurological ROS  negative psych ROS   GI/Hepatic Neg liver ROS, IBS   Endo/Other  negative endocrine ROS  Renal/GU negative Renal ROS  negative genitourinary   Musculoskeletal negative musculoskeletal ROS (+)   Abdominal   Peds negative pediatric ROS (+)  Hematology negative hematology ROS (+)   Anesthesia Other Findings   Reproductive/Obstetrics (+) Pregnancy                             Anesthesia Physical Anesthesia Plan  ASA: 2  Anesthesia Plan: Epidural   Post-op Pain Management:    Induction:   PONV Risk Score and Plan:   Airway Management Planned: Natural Airway  Additional Equipment:   Intra-op Plan:   Post-operative Plan:   Informed Consent:   Plan Discussed with: Anesthesiologist  Anesthesia Plan Comments:         Anesthesia Quick Evaluation

## 2020-12-01 ENCOUNTER — Other Ambulatory Visit (HOSPITAL_COMMUNITY): Payer: Self-pay

## 2020-12-01 MED ORDER — IBUPROFEN 600 MG PO TABS
600.0000 mg | ORAL_TABLET | Freq: Four times a day (QID) | ORAL | 0 refills | Status: DC | PRN
  Filled 2020-12-01: qty 30, 8d supply, fill #0

## 2020-12-01 NOTE — Lactation Note (Signed)
This note was copied from a baby's chart. Lactation Consultation Note Experienced BF mom of 3 yrs w/her now 40 yr old states this baby is doing well. He had a sleepy time earlier but is back to feeding again. The baby was also spitty earlier as well. Baby was swaddled. Suggested STS and lay cover over outside of baby. Positioning, newborn feeding habits, I&O, supply and demand discussed.  Encouraged mom to call for questions or concerns. Lactation brochure given.  Patient Name: Bianca Hodge [id number] Date: 12/01/2020 Reason for consult: Initial assessment;Term Age:74 hours  Maternal Data Has patient been taught Hand Expression?: Yes Does the patient have breastfeeding experience prior to this delivery?: Yes How long did the patient breastfeed?: 3 yrs  Feeding    LATCH Score Latch: Grasps breast easily, tongue down, lips flanged, rhythmical sucking.  Audible Swallowing: A few with stimulation  Type of Nipple: Everted at rest and after stimulation  Comfort (Breast/Nipple): Soft / non-tender  Hold (Positioning): No assistance needed to correctly position infant at breast.  LATCH Score: 9   Lactation Tools Discussed/Used    Interventions Interventions: Breast feeding basics reviewed;Position options;Skin to skin  Discharge WIC Program: No  Consult Status Consult Status: PRN Date: 12/01/20 Follow-up type: In-patient    [name], Diamond Nickel 12/01/2020, 1:14 AM

## 2020-12-01 NOTE — Anesthesia Postprocedure Evaluation (Signed)
Anesthesia Post Note  Patient: Bianca Hodge  Procedure(s) Performed: AN AD HOC LABOR EPIDURAL     Patient location during evaluation: Mother Baby Anesthesia Type: Epidural Level of consciousness: awake, oriented and awake and alert Pain management: pain level controlled Vital Signs Assessment: post-procedure vital signs reviewed and stable Respiratory status: spontaneous breathing, respiratory function stable and nonlabored ventilation Cardiovascular status: stable Postop Assessment: no headache, adequate PO intake, able to ambulate, patient able to bend at knees, no backache and no apparent nausea or vomiting Anesthetic complications: no   No notable events documented.  Last Vitals:  Vitals:   12/01/20 0000 12/01/20 0503  BP: 125/67 122/65  Pulse: 63 61  Resp: 18 16  Temp: 36.7 C 36.7 C  SpO2: 98% 100%    Last Pain:  Vitals:   12/01/20 0850  TempSrc:   PainSc: 2    Pain Goal: Patients Stated Pain Goal: 3 (12/01/20 0850)              Epidural/Spinal Function Cutaneous sensation: Normal sensation (12/01/20 0850), Patient able to flex knees: Yes (12/01/20 0850), Patient able to lift hips off bed: Yes (12/01/20 0850), Back pain beyond tenderness at insertion site: No (12/01/20 0850), Progressively worsening motor and/or sensory loss: No (12/01/20 0850), Bowel and/or bladder incontinence post epidural: No (12/01/20 0850)  Marcellas Marchant

## 2020-12-01 NOTE — Social Work (Signed)
CSW received consult for Edinburgh 10. CSW met with MOB to offer support and complete assessment.    CSW met with MOB at bedside. CSW observed MOB holding and bonding with the infant. CSW congratulated MOB and introduced CSW role. CSW explained the reason for the visit, to discuss [place]. MOB presented pleasant, calm and welcoming of [place] visit. CSW inquired how MOB has felt since birth. MOB reports, "I feel pretty good, but tired."  CSW discussed MOB [place].  MOB disclosed she tested positive for Covid and had symptoms. MOB report for the past two weeks she has been very fatigued and worried about the health of the baby. MOB disclosed she had difficulty sleeping since she was always uncomfortable. MOB reports since having the baby she feels better and is looking forward to going home. MOB report she does not have a history of anxiety or depression.  CSW inquired if MOB experienced postpartum. MOB reports she did not experience postpartum however the first day home she had racing thoughts, but they subsided the next day. CSW inquired about MOB coping skills. 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 She enjoys reading and spending outdoors with her family. CSW praised MOB for her coping skills. CSW inquired about MOB supports. MOB acknowledges her husband, mother-in-law and extended family are supports. CSW provided education regarding the baby blues period vs. perinatal mood disorders, discussed treatment and gave resources for mental health follow up if concerns arise. CSW recommended MOB complete a self-evaluation during the postpartum time period using the New Mom Checklist from Postpartum Progress and encouraged MOB to contact a medical professional if symptoms are noted at any time. MOB reports she feels comfortable reaching out to her physician if concerns arise. MOB disclosed she thinks her spouse could benefit from the resources as well and plans to share with him. CSW praised MOB  for her efforts. CSW assessed MOB for safety. MOB denies thoughts of harm to self and others. MOB denies Domestic Violence.    CSW provided review of Sudden Infant Death Syndrome (SIDS) precautions and informed MOB no-co sleeping with the infant. MOB reports the infant will sleep in a bassinet. MOB has chosen [employer]. CSW assessed MOB for additional needs. MOB reports no further needs.   CSW identifies no further need for intervention and no barriers to discharge at this time.   Kathrin Greathouse, MSW, LCSW [facility] and [place] Worker  [phone] 12/01/2020  10:54 AM

## 2020-12-07 ENCOUNTER — Other Ambulatory Visit: Payer: Self-pay

## 2020-12-07 ENCOUNTER — Ambulatory Visit (INDEPENDENT_AMBULATORY_CARE_PROVIDER_SITE_OTHER): Payer: No Typology Code available for payment source

## 2020-12-07 VITALS — BP 116/76 | HR 75 | Wt 198.4 lb

## 2020-12-07 DIAGNOSIS — Z0130 Encounter for examination of blood pressure without abnormal findings: Secondary | ICD-10-CM

## 2020-12-07 NOTE — Progress Notes (Signed)
Agree with A & P. 

## 2020-12-07 NOTE — Progress Notes (Signed)
Patient here for blood pressure check following spontaneous vaginal delivery on 11/30/20. No elevated BP during pregnancy. Multiple elevated BP during admission for delivery. Pt was normotensive prior to discharge; not given any BP med at discharge. BP today is 116/76, HR 75. Patient denies any dizzness, blurred vision, or headache. Reviewed with Alysia Penna, MD, who recommends pt follow up as previously scheduled. PP visit 12/28/20.  Reports breastfeeding is going well. Offered lactation services if needed. Pt endorses uterine cramps that are relieved with ibuprofen once a day. Reports some constipation, taking Senakot once a day. Recommended patient add Miralax once a day or increase Senakot as directed on packaging.  Fleet Contras RN 12/07/20

## 2020-12-12 ENCOUNTER — Telehealth (HOSPITAL_COMMUNITY): Payer: Self-pay | Admitting: *Deleted

## 2020-12-12 NOTE — Telephone Encounter (Signed)
Left message to return nurse call. EPDS in hospital = 10. Had appt with [facility] for [facility] 2&9 on 12/07/2020 with score of 4.   Duffy Rhody, RN 12/12/2020 at 2:56pm

## 2020-12-12 NOTE — Telephone Encounter (Signed)
Mom reports feeling tired, but good. Had appt at [facility] with PHQ = 4 on 12/07/2020. Returns for recheck on Dec 28, 2020 per patient. No concerns about herself. Reports baby is doing well. Feeding, peeing, and pooping without difficulty. No concerns about baby.  Duffy Rhody, RN 12/12/2020 at 3:53pm

## 2020-12-28 ENCOUNTER — Ambulatory Visit (INDEPENDENT_AMBULATORY_CARE_PROVIDER_SITE_OTHER): Payer: No Typology Code available for payment source | Admitting: Certified Nurse Midwife

## 2020-12-28 ENCOUNTER — Other Ambulatory Visit: Payer: Self-pay

## 2020-12-28 DIAGNOSIS — K5909 Other constipation: Secondary | ICD-10-CM

## 2020-12-28 DIAGNOSIS — M6208 Separation of muscle (nontraumatic), other site: Secondary | ICD-10-CM

## 2020-12-28 DIAGNOSIS — N393 Stress incontinence (female) (male): Secondary | ICD-10-CM

## 2020-12-28 NOTE — Progress Notes (Signed)
Post Partum Visit Note  Bianca Hodge is a 40 y.o. G32P2012 female who presents for a postpartum visit. She is 4 weeks postpartum following a normal spontaneous vaginal delivery.  I have fully reviewed the prenatal and intrapartum course. The delivery was at [redacted]w[redacted]d gestational weeks.  Anesthesia: epidural. Postpartum course has been normal and uncomplicated. Baby is doing well (gaining fast). Baby is feeding by breast. Bleeding no bleeding. Bowel function is normal. Bladder function is normal. Patient is not sexually active. Contraception method is condoms, vasectomy, and NFP . Postpartum depression screening: negative.  The pregnancy intention screening data noted above was reviewed. Potential methods of contraception were discussed. The patient elected to proceed with FAM or LAM; Female Condom; Vasectomy.   Edinburgh Postnatal Depression Scale - 12/28/20 1435       Edinburgh Postnatal Depression Scale:  In the Past 7 Days   I have been able to laugh and see the funny side of things. 0    I have looked forward with enjoyment to things. 0    I have blamed myself unnecessarily when things went wrong. 0    I have been anxious or worried for no good reason. 0    I have felt scared or panicky for no good reason. 0    Things have been getting on top of me. 1    I have been so unhappy that I have had difficulty sleeping. 0    I have felt sad or miserable. 0    I have been so unhappy that I have been crying. 0    The thought of harming myself has occurred to me. 0    Edinburgh Postnatal Depression Scale Total 1             Health Maintenance Due  Topic Date Due   INFLUENZA VACCINE  12/26/2020    The following portions of the patient's history were reviewed and updated as appropriate: allergies, current medications, past family history, past medical history, past social history, past surgical history, and problem list.  Review of Systems Pertinent items noted in HPI and remainder of  comprehensive ROS otherwise negative.  Objective:  BP 102/67   Pulse 67   Wt 192 lb (87.1 kg)   LMP 02/29/2020   Breastfeeding Yes   BMI 30.07 kg/m    General:  alert, cooperative, appears stated age, and no distress   Breasts:  normal  Lungs: Normal effort  Heart:  regular rate and rhythm  Abdomen: Soft, non-tender    Wound N/A\  GU exam:  normal       Assessment:  Postpartum care following vaginal delivery  Urinary, incontinence, stress female - Plan: Ambulatory referral to Physical Therapy  Other constipation  Diastasis recti - Plan: Ambulatory referral to Physical Therapy  Plan:   Essential components of care per ACOG recommendations:  1.  Mood and well being: Patient with negative depression screening today. Reviewed local resources for support.  - Patient tobacco use? No.   - hx of drug use? No.    2. Infant care and feeding:  -Patient currently breastmilk feeding? Yes. Discussed returning to work and pumping. Reviewed importance of draining breast regularly to support lactation.  -Social determinants of health (SDOH) reviewed in EPIC. No concerns  3. Sexuality, contraception and birth spacing - Patient does not want a pregnancy in the next year.  Desired family size is 2 children.  - Reviewed forms of contraception in tiered fashion. Patient desired condoms, vasectomy,  natural family planning (NFP) today.   - Discussed birth spacing of 18 months  4. Sleep and fatigue -Encouraged family/partner/community support of 4 hrs of uninterrupted sleep to help with mood and fatigue  5. Physical Recovery  - Discussed patients delivery and complications. She describes her labor as good. - Patient had a Vaginal, no problems at delivery. Patient had a 3rd degree laceration. Perineal healing reviewed. Patient expressed understanding - Patient has urinary incontinence? Yes. Discussed role of pelvic floor PT. Offered PT and patient accepted. Patient was referred to pelvic  floor PT.  - Patient is safe to resume physical and sexual activity as desired. Advised to attend PFPT before engaging in heavy lifting/boxing.  6.  Health Maintenance - HM due items addressed Yes - Last pap smear  Diagnosis  Date Value Ref Range Status  01/14/2019   Final   NEGATIVE FOR INTRAEPITHELIAL LESIONS OR MALIGNANCY.  01/14/2019   Final   A LETTER WAS SENT TO THE PATIENT INFORMING HER OF THE ABOVE RESULTS.   Pap smear not done at today's visit.  -Breast Cancer screening indicated? No.   7. Chronic Disease/Pregnancy Condition follow up: None  - PCP follow up  Bernerd Limbo, CNM [facility] [employer], [facility]

## 2020-12-30 ENCOUNTER — Encounter: Payer: Self-pay | Admitting: Certified Nurse Midwife

## 2021-01-02 ENCOUNTER — Telehealth: Payer: Self-pay | Admitting: Lactation Services

## 2021-01-02 NOTE — Telephone Encounter (Signed)
Called and spoke with patient. Patient reports she has not had a fever tonight. She reports she was prescribed ATB and Motrin last night. She reports some firmness and redness to breast. She feels like she is a lot better today after starting ATB. Started with a plug duct and was using warm showers, massage and increased pumping. Infant is latching well to the breast.    Patient reports she is using the [facility] pump and noticed less amount with pumping in the last few days. May be due to Mastitis and/or normal self regulation. Mom is pumping once a day for dad to give bottle, advised her to continue as she has been but no over pumping. Mom reports when she was feeling ill, she was not eating or drinking well  Mom reports the [place] works well for her.   Reviewed with patient to:   Apply ice to breast before pumping or feeding Feed infant as you have been pump every 3 hours if infant is not latching to the breast, or anytime infant is getting a bottle No deep massage to the breast Milk supply may decrease temporarily  Mom voiced understanding. Mom to call with any further questions or concerns as needed.

## 2021-01-16 ENCOUNTER — Telehealth: Payer: Self-pay | Admitting: Lactation Services

## 2021-01-16 NOTE — Telephone Encounter (Signed)
Called patient in regards to her message of possible Mastitis and Fever was not able to reach patient, LM for patient to call the office.

## 2021-01-17 ENCOUNTER — Telehealth: Payer: Self-pay | Admitting: Lactation Services

## 2021-01-17 NOTE — Telephone Encounter (Signed)
Called and talked with mom after My Chart message. Infant is 7 weeks tomorrow.   Mom feels that infant struggles getting a burp up and when he does, it is very loud. Mom burps on her shoulder and ery gently pat and rub his back. He spits up with almost every burp, not more than a mouth full. He is spitting more in the last few days. Mom keeps infant upright after feeds to help with gassiness. She has used Mylicon a few times and infant spit up.   Mom reports that infant struggles some on the right breast with the increased flow at the breast. Mom is using the cradle hold and leans back some. Infant does not choke but will pull off and cry with feeding. Milk is spraying at that time. Infant does well on the left breast and prefers that side.   Reviewed using a laid back side position or side lying on right breast. She tried side lying with son once about 2 weeks and noted some pain with feeding.   Mom is using the [facility] pump. It is inconsistent with emptying the breast. She is pumping once a day to give infant a bottle to that someone else can feed him. She stops at 4-6 ounces with pumping. She can get 5 ounces in 4 minutes of pumping in the morning.   Mom did have letdown pain previously and with initial latch but that has improved. Nipple is usually rounded when infant comes off the right breast and more lipstick shaped after every feeding. Mom reports some intermittent clicking on the breast.  Asked mom to check for lip flanging on the breast and the bottle.   Stools are pretty regular, they are usually yellow and seedy.   Reviewed mom's diet and she reports she feels he was more effected up to about 2 weeks ago like spicy [place] food and Cheese.   Mom feeds infant on one breast per feeding.  Infant weight gain is good.   Impression:  Forceful letdown Milk supply self regulating downward but not all the way down to infant needs Infant gulping air with feeding causing excess gas  Reviewed  with mom to feed in the laid back position.  Pulling infant off just as letdown occurs and let milk run into towel or express 1/2 ounces and then latch to the right breast Reviewed not pumping more to increase supply Reviewed cabbage leaves to the right breast twice a day for 24 hours Reviewed block schedule for feeding as a last resort Reviewed Probiotic for infant due to mom being on antibiotics recently for Mastitis Come in for OP Lactation appointment if not improving.

## 2021-02-17 ENCOUNTER — Encounter: Payer: Self-pay | Admitting: Physical Therapy

## 2021-02-17 ENCOUNTER — Other Ambulatory Visit: Payer: Self-pay

## 2021-02-17 ENCOUNTER — Ambulatory Visit: Payer: No Typology Code available for payment source | Attending: Certified Nurse Midwife | Admitting: Physical Therapy

## 2021-02-17 DIAGNOSIS — M6281 Muscle weakness (generalized): Secondary | ICD-10-CM | POA: Insufficient documentation

## 2021-02-17 DIAGNOSIS — R252 Cramp and spasm: Secondary | ICD-10-CM | POA: Diagnosis present

## 2021-02-17 DIAGNOSIS — N393 Stress incontinence (female) (male): Secondary | ICD-10-CM | POA: Insufficient documentation

## 2021-02-17 DIAGNOSIS — R278 Other lack of coordination: Secondary | ICD-10-CM | POA: Diagnosis present

## 2021-02-17 NOTE — Therapy (Signed)
[facility] [address] [place], [place], [address] Phone: [phone]   Fax:  [phone]  Physical Therapy Evaluation  Patient Details  Name: Bianca Hodge MRN: [id number] Date of Birth: 01-14-1981 Referring Provider (PT): Rockleigh R. Dan Humphreys, CNM   Encounter Date: 02/17/2021   PT End of Session - 02/17/21 1120     Visit Number 1    Date for PT Re-Evaluation 05/12/21    Authorization Type UHC    PT Start Time 0845    PT Stop Time 0930    PT Time Calculation (min) 45 min    Activity Tolerance Patient tolerated treatment well    Behavior During Therapy [facility] for tasks assessed/performed             Past Medical History:  Diagnosis Date   Parasite infection 06-04-2014   Supervision of high risk pregnancy, antepartum 05/25/2020    Nursing Staff Provider  Office Location  [facility] Dating  LMP  Language  English Anatomy US  Normal - EFW 93%  Flu Vaccine  01/29/20 Genetic Screen  NIPS: normal female  AFP:  negative   TDaP Vaccine  09/29/20 Hgb A1C or  GTT Early  Third trimester   COVID Vaccine 08/13/19-09/03/19-04/15/20   LAB RESULTS   Rhogam   NA O+ Blood Type O/Positive/-- (12/29 1557)   Feeding Plan Breast Antibody Negative (12/29     Past Surgical History:  Procedure Laterality Date   APPENDECTOMY      There were no vitals filed for this visit.    Subjective Assessment - 02/17/21 0858     Subjective Patient had her second child on 11/30/2020, boy, vaginally. Baby was 8 pounds. Patient had a second degree tear. Patient had COVID at 40 weeks. Patient is having urinary leakage. Patient is nervouse to start exercising due to making the Diastasis Recti worse.    Patient Stated Goals understand the correct exercises for the Diastasis; reduce the urinary leakage; return to prior exericse routine safely    Currently in Pain? No/denies    Multiple Pain Sites No                [facility] PT Assessment - 02/17/21 0001        Assessment   Medical Diagnosis N39.3 Urinary, incontinence, stress female; M62.08 Diastasis Recti    Referring Provider (PT) Jamilla R. Walker, CNM    Onset Date/Surgical Date 11/30/20    Prior Therapy yes      Precautions   Precautions None      Restrictions   Weight Bearing Restrictions No      Balance Screen   Has the patient fallen in the past 6 months No    Has the patient had a decrease in activity level because of a fear of falling?  No    Is the patient reluctant to leave their home because of a fear of falling?  No      Home Tourist information centre manager residence      Prior Function   Level of Independence Independent    Vocation Full time employment    Vocation Requirements sitting    Leisure wanting exercise routine- boxing, hiking, swimming      Cognition   Overall Cognitive Status Within Functional Limits for tasks assessed      Observation/Other Assessments   Skin Integrity appendix scar has fascial restrictions      ROM / Strength   AROM / PROM / Strength  AROM;PROM;Strength      AROM   Lumbar Flexion tigth in the lumbar area    Lumbar Extension decreased by 50%    Lumbar - Right Side Bend decreased by 25% with pain    Lumbar - Left Side Bend decreased by 25% with pain    Lumbar - Right Rotation decreased by 25% with pain    Lumbar - Left Rotation decreased by 25% with pain      PROM   Right Hip External Rotation  40    Left Hip External Rotation  60      Strength   Right Hip ABduction 4/5    Left Hip Extension 4/5      Palpation   Spinal mobility decreased mobiltiy of T10-L5    SI assessment  left ilium posteriorly rotated    Palpation comment tenderness located throughout the abdomen; Increased rib angle and decreased lower rib cage mobility; fascial tightness in the lower thoracic and lumbar region      Special Tests    Special Tests Hip Special Tests      Trendelenburg Test   Findings Positive    Side Right    Comments  left hip drops                        Objective measurements completed on examination: See above findings.     Pelvic Floor Special Questions - 02/17/21 0001     Prior Pregnancies Yes    Number of Pregnancies 2    Number of Vaginal Deliveries 2    Diastasis Recti 3 finger width below and above the umbilicus; tenderness on the lower diastasis Recti    Currently Sexually Active Yes    Is this Painful No    Urinary Leakage Yes    Activities that cause leaking Sneezing;Coughing;Lifting;Walking    Urinary urgency No    Fecal incontinence No    Falling out feeling (prolapse) No    External Palpation tenderness located in the perineal body    Prolapse None    Pelvic Floor Internal Exam Patient confirms identification and approves PT to assess pelvic floor    Exam Type Vaginal    Palpation bil. levator ani, obturaotr internist; perineal body left >/ right; decreased contraction on the sides    Strength weak squeeze, no lift                       PT Education - 02/17/21 1120     Education Details educated patient on perineal massage and diaphragmatic breathing    Person(s) Educated Patient    Methods Explanation;Demonstration;Handout    Comprehension Verbalized understanding;Returned demonstration              PT Short Term Goals - 02/17/21 1135       PT SHORT TERM GOAL #1   Title independent with initial HEP    Time 4    Period Weeks    Status New    Target Date 03/17/21      PT SHORT TERM GOAL #2   Title reduction of diastasis recti to 2 fingers width    Time 4    Period Weeks    Status New    Target Date 03/17/21      PT SHORT TERM GOAL #3   Title pelvis in correct alignment so she is able to engage the pelvic floor with circular pattern    Time 4  Period Weeks    Status New    Target Date 03/17/21      PT SHORT TERM GOAL #4   Title ----               PT Long Term Goals - 02/17/21 1137       PT LONG TERM GOAL #1    Title independent with HEP and understand how to progress herself safely without straining the diastasis recti    Time 12    Period Weeks    Status New    Target Date 05/12/21      PT LONG TERM GOAL #2   Title urinary leakage improved >/= 75% due to pelvic floor strength improved >/= 1 muscle grade    Time 12    Period Weeks    Status New    Target Date 05/12/21      PT LONG TERM GOAL #3   Title reduction of trigger points in the pelvic floor to have no discomfort with penile penetration or touching the area of the second degree tear    Time 12    Period Weeks    Status New    Target Date 05/12/21      PT LONG TERM GOAL #4   Title diastasis recti decreased to less than 1 fingers width with good contraction and resistance of tissue    Time 12    Period Weeks    Status New    Target Date 05/12/21                    Plan - 02/17/21 1122     Clinical Impression Statement Patient is a 40 year old female with urinary leakage, diastasis recti and discomfort with penile penetration since she has her son vaginally on 11/30/2020. Patient has a second degree tear vaginally. She is presently breast feeding but not reporting and vaginal dryness. Patient has a 3 finger width above and below the diastasis recti with tenderness on the lower portion. Tenderness located throughout the abdomen, lumbar paraspinals, on the pubic symphysis, perineal body, superior transeverse perineum, levator ani and obturator internist. Her second degress tear is healed but tender and restricted. Decreased lumbar ROM with tightness in the thoracolumbar fascia. Increased rib angle. Positive Trendelenberg on the right. Decreased hip external rotation bilaterally. Patient will benefit from skilled therapy to address the impairments and have her to return to exercise without challenging her diastasis recti.    Personal Factors and Comorbidities Fitness;Sex;Comorbidity 1    Comorbidities vaginal birth on 11/30/2020  with second degree tear    Examination-Activity Limitations Continence   sneeze and cough   Examination-Participation Restrictions Interpersonal Relationship    Stability/Clinical Decision Making Stable/Uncomplicated    Clinical Decision Making Low    Rehab Potential Excellent    PT Frequency 1x / week   every other week   PT Treatment/Interventions ADLs/Self Care Home Management;Biofeedback;Cryotherapy;Electrical Stimulation;Moist Heat;Therapeutic activities;Therapeutic exercise;Neuromuscular re-education;Patient/family education;Manual techniques;Scar mobilization;Dry needling;Taping;Spinal Manipulations;Joint Manipulations    PT Next Visit Plan manual work to the back  and reduction of diastasis recti; rib mobility to  reduce rib angle; abdominal bracing; correct pelvis; manual work to the pelvic floor    Consulted and Agree with Plan of Care Patient             Patient will benefit from skilled therapeutic intervention in order to improve the following deficits and impairments:  Decreased coordination, Decreased range of motion, Increased fascial restricitons, Decreased endurance, Decreased  activity tolerance, Increased muscle spasms, Pain, Decreased strength, Decreased mobility  Visit Diagnosis: Muscle weakness (generalized) - Plan: PT plan of care cert/re-cert  Other lack of coordination - Plan: PT plan of care cert/re-cert  Stress incontinence (female) (female) - Plan: PT plan of care cert/re-cert  Cramp and spasm - Plan: PT plan of care cert/re-cert     Problem List Patient Active Problem List   Diagnosis Date Noted   Irritable bowel syndrome (IBS) 04/04/2015    Eulis Foster, PT 02/17/21 11:42 AM  [facility] [address], [address] [place], [place], [address] Phone: [phone]   Fax:  [phone]  Name: Rebecah Dangerfield MRN: [id number] Date of Birth: [birth year]

## 2021-02-27 ENCOUNTER — Encounter: Payer: Self-pay | Admitting: Physical Therapy

## 2021-03-01 ENCOUNTER — Other Ambulatory Visit: Payer: Self-pay

## 2021-03-01 ENCOUNTER — Encounter: Payer: Self-pay | Admitting: Physical Therapy

## 2021-03-01 ENCOUNTER — Ambulatory Visit: Payer: No Typology Code available for payment source | Attending: Certified Nurse Midwife | Admitting: Physical Therapy

## 2021-03-01 DIAGNOSIS — M6281 Muscle weakness (generalized): Secondary | ICD-10-CM | POA: Diagnosis present

## 2021-03-01 DIAGNOSIS — R252 Cramp and spasm: Secondary | ICD-10-CM | POA: Insufficient documentation

## 2021-03-01 DIAGNOSIS — N393 Stress incontinence (female) (male): Secondary | ICD-10-CM | POA: Diagnosis present

## 2021-03-01 DIAGNOSIS — R278 Other lack of coordination: Secondary | ICD-10-CM | POA: Insufficient documentation

## 2021-03-01 NOTE — Therapy (Addendum)
[facility] [address] [place], [place], [address] Phone:     Fax:     Physical Therapy Treatment  Patient Details  Name: Bianca Hodge MRN: [id number] Date of Birth: 1980/11/25 Referring Provider (PT): Winona R. Dan Humphreys, CNM   Encounter Date: 03/01/2021   PT End of Session - 03/01/21 1745     Visit Number 2    Date for PT Re-Evaluation 05/12/21    Authorization Type UHC    PT Start Time 1530    PT Stop Time 1614    PT Time Calculation (min) 44 min    Activity Tolerance Patient tolerated treatment well    Behavior During Therapy [facility] for tasks assessed/performed             Past Medical History:  Diagnosis Date   Parasite infection 06-04-2014   Supervision of high risk pregnancy, antepartum 05/25/2020    Nursing Staff Provider  Office Location  [facility] Dating  LMP  Language  English Anatomy US  Normal - EFW 93%  Flu Vaccine  01/29/20 Genetic Screen  NIPS: normal female  AFP:  negative   TDaP Vaccine  09/29/20 Hgb A1C or  GTT Early  Third trimester   COVID Vaccine 08/13/19-09/03/19-04/15/20   LAB RESULTS   Rhogam   NA O+ Blood Type O/Positive/-- (12/29 1557)   Feeding Plan Breast Antibody Negative (12/29     Past Surgical History:  Procedure Laterality Date   APPENDECTOMY      There were no vitals filed for this visit.   Subjective Assessment - 03/01/21 1531     Subjective I feel the incontinence is improving. When sneeze and cough I leak some. I walk per day. Only changing underwear 1 time per day.    Patient Stated Goals understand the correct exercises for the Diastasis; reduce the urinary leakage; return to prior exericse routine safely    Currently in Pain? No/denies    Multiple Pain Sites No                               [facility] Adult PT Treatment/Exercise - 03/01/21 0001       Neuro Re-ed    Neuro Re-ed Details  diaphragmatic breathing in the lower rib cage for 360 degres  expansion; sitting and standing with core engagement without hinging at the thoracolumbar junction and lowering the upper rib cage.      Manual Therapy   Manual Therapy Soft tissue mobilization;Myofascial release;Taping    Soft tissue mobilization manual mobilization to improve mobility of the lumbar paraspinals, obliques, intercoastals    Myofascial Release using suction cups to pull on the skin to release the tight fascia and improve muscle mobility    Kinesiotex Facilitate Muscle      Kinesiotix   Facilitate Muscle  to correct diastasis recti with 3 strips across on each side                     PT Education - 03/01/21 1613     Education Details Access Code: [email or web address]    Person(s) Educated Patient    Methods Explanation;Demonstration;Verbal cues;Handout    Comprehension Returned demonstration;Verbalized understanding              PT Short Term Goals - 02/17/21 1135       PT SHORT TERM GOAL #1   Title independent with initial HEP  Time 4    Period Weeks    Status New    Target Date 03/17/21      PT SHORT TERM GOAL #2   Title reduction of diastasis recti to 2 fingers width    Time 4    Period Weeks    Status New    Target Date 03/17/21      PT SHORT TERM GOAL #3   Title pelvis in correct alignment so she is able to engage the pelvic floor with circular pattern    Time 4    Period Weeks    Status New    Target Date 03/17/21      PT SHORT TERM GOAL #4   Title ----               PT Long Term Goals - 02/17/21 1137       PT LONG TERM GOAL #1   Title independent with HEP and understand how to progress herself safely without straining the diastasis recti    Time 12    Period Weeks    Status New    Target Date 05/12/21      PT LONG TERM GOAL #2   Title urinary leakage improved >/= 75% due to pelvic floor strength improved >/= 1 muscle grade    Time 12    Period Weeks    Status New    Target Date 05/12/21      PT LONG TERM GOAL #3    Title reduction of trigger points in the pelvic floor to have no discomfort with penile penetration or touching the area of the second degree tear    Time 12    Period Weeks    Status New    Target Date 05/12/21      PT LONG TERM GOAL #4   Title diastasis recti decreased to less than 1 fingers width with good contraction and resistance of tissue    Time 12    Period Weeks    Status New    Target Date 05/12/21                   Plan - 03/01/21 1614     Clinical Impression Statement Patient had tightness in the lumbar and rib cage. She was able to move her rib cage after the manual work. She needs verbal cues to not hinge at the thoracolumbar junction as she braces her abdominals. She has the hardest time lowering her lower rib cage due to the upper abdominals are not engaging fully. Patient is using the kinesio tape to assist in reduction of the diastasis recti. Patient urinary leakage is better just more when she coughs. Patient will benefit from skilled therapy to improve her core strength and rib mobility to also reduce her urinary leakage.    Personal Factors and Comorbidities Fitness;Sex;Comorbidity 1    Comorbidities vaginal birth on 11/30/2020 with second degree tear    Examination-Activity Limitations Continence   sneeze cough   Examination-Participation Restrictions Interpersonal Relationship    Stability/Clinical Decision Making Stable/Uncomplicated    Rehab Potential Excellent    PT Frequency 1x / week   every other week   PT Duration Other (comment)   4 months   PT Treatment/Interventions ADLs/Self Care Home Management;Biofeedback;Cryotherapy;Electrical Stimulation;Moist Heat;Therapeutic activities;Therapeutic exercise;Neuromuscular re-education;Patient/family education;Manual techniques;Scar mobilization;Dry needling;Taping;Spinal Manipulations;Joint Manipulations    PT Next Visit Plan abdominal bracing; correct pelvis; manual work to the pelvic floor, see how the taping  is going; work on  upper abdominal engagement    PT Home Exercise Plan Access Code: [email or web address]    Recommended Other Services MD signed initial note    Consulted and Agree with Plan of Care Patient             Patient will benefit from skilled therapeutic intervention in order to improve the following deficits and impairments:  Decreased coordination, Decreased range of motion, Increased fascial restricitons, Decreased endurance, Decreased activity tolerance, Increased muscle spasms, Pain, Decreased strength, Decreased mobility  Visit Diagnosis: Muscle weakness (generalized)  Other lack of coordination  Stress incontinence (female) (female)  Cramp and spasm     Problem List Patient Active Problem List   Diagnosis Date Noted   Irritable bowel syndrome (IBS) 04/04/2015    Eulis Foster, PT 03/01/21 5:46 PM  [facility] [address] [place], [place], [address] Phone:     Fax:     Name: Bianca Hodge MRN: [id number] Date of Birth: 1980-06-26

## 2021-03-01 NOTE — Patient Instructions (Signed)
Access Code: [email or web address] URL: [email or web address] Date: 03/01/2021 Prepared by: Eulis Foster  Exercises Supine Transversus Abdominis Bracing - Hands on Stomach - 1 x daily - 7 x weekly - 1 sets - 10 reps - 5 sec hold Quadruped Transversus Abdominis Bracing - 1 x daily - 7 x weekly - 1 sets - 10 reps - 5 sec hold Quadruped Full Range Thoracic Rotation with Reach - 1 x daily - 7 x weekly - 1 sets - 10 reps [facility] [address], [address] [place], [place] [address] Phone # [phone] Fax [phone]

## 2021-03-17 ENCOUNTER — Ambulatory Visit: Payer: No Typology Code available for payment source | Admitting: Physical Therapy

## 2021-03-17 ENCOUNTER — Other Ambulatory Visit: Payer: Self-pay

## 2021-03-17 ENCOUNTER — Encounter: Payer: Self-pay | Admitting: Physical Therapy

## 2021-03-17 DIAGNOSIS — R252 Cramp and spasm: Secondary | ICD-10-CM

## 2021-03-17 DIAGNOSIS — M6281 Muscle weakness (generalized): Secondary | ICD-10-CM

## 2021-03-17 DIAGNOSIS — N393 Stress incontinence (female) (male): Secondary | ICD-10-CM

## 2021-03-17 DIAGNOSIS — R278 Other lack of coordination: Secondary | ICD-10-CM

## 2021-03-17 NOTE — Therapy (Signed)
[facility] [address] [place], [place], [address] Phone: [phone]   Fax:  [phone]  Physical Therapy Treatment  Patient Details  Name: Bianca Hodge MRN: [id number] Date of Birth: [birth year] Referring Provider (PT): Rangely R. Dan Humphreys, CNM   Encounter Date: 03/17/2021   PT End of Session - 03/17/21 1025     Visit Number 3    Date for PT Re-Evaluation 05/12/21    Authorization Type UHC    PT Start Time 1015    PT Stop Time 1055    PT Time Calculation (min) 40 min    Activity Tolerance Patient tolerated treatment well    Behavior During Therapy [facility] for tasks assessed/performed             Past Medical History:  Diagnosis Date   Parasite infection 06-04-2014   Supervision of high risk pregnancy, antepartum 05/25/2020    Nursing Staff Provider  Office Location  [facility] Dating  LMP  Language  English Anatomy US  Normal - EFW 93%  Flu Vaccine  01/29/20 Genetic Screen  NIPS: normal female  AFP:  negative   TDaP Vaccine  09/29/20 Hgb A1C or  GTT Early  Third trimester   COVID Vaccine 08/13/19-09/03/19-04/15/20   LAB RESULTS   Rhogam   NA O+ Blood Type O/Positive/-- (12/29 1557)   Feeding Plan Breast Antibody Negative (12/29     Past Surgical History:  Procedure Laterality Date   APPENDECTOMY      There were no vitals filed for this visit.   Subjective Assessment - 03/17/21 1021     Subjective The tape I bought irritated my skin. Urinary leakage is 75% better. 1 day out of the week I am fully dry.    Patient Stated Goals understand the correct exercises for the Diastasis; reduce the urinary leakage; return to prior exericse routine safely    Currently in Pain? No/denies    Multiple Pain Sites No                            Pelvic Floor Special Questions - 03/17/21 0001     Diastasis Recti 1 finge width above and below umbilicus               [facility] Adult PT Treatment/Exercise -  03/17/21 0001       Neuro Re-ed    Neuro Re-ed Details  diaphragmatic breathing in sidely and supine with therapist assisting the rib cage to domw downward and engaging the upper abdomen; supine with fascilitating the rectus to contract to reduce the diastasis      Manual Therapy   Manual Therapy Soft tissue mobilization;Joint mobilization    Joint Mobilization PA and rotational mobilization to T4-L5; lower rib mobilization to improve downward movement    Soft tissue mobilization to lumbar paraspinals, upper thoracic area; along the right gluteus medius    Myofascial Release using suction cup on the thoracic and lumbar to release the fascia                       PT Short Term Goals - 03/17/21 1105       PT SHORT TERM GOAL #1   Title independent with initial HEP    Time 4    Period Weeks    Status Achieved    Target Date 03/17/21      PT SHORT TERM GOAL #2  Title reduction of diastasis recti to 2 fingers width    Time 4    Period Weeks    Status Achieved      PT SHORT TERM GOAL #3   Title pelvis in correct alignment so she is able to engage the pelvic floor with circular pattern    Time 4    Period Weeks    Status Achieved               PT Long Term Goals - 02/17/21 1137       PT LONG TERM GOAL #1   Title independent with HEP and understand how to progress herself safely without straining the diastasis recti    Time 12    Period Weeks    Status New    Target Date 05/12/21      PT LONG TERM GOAL #2   Title urinary leakage improved >/= 75% due to pelvic floor strength improved >/= 1 muscle grade    Time 12    Period Weeks    Status New    Target Date 05/12/21      PT LONG TERM GOAL #3   Title reduction of trigger points in the pelvic floor to have no discomfort with penile penetration or touching the area of the second degree tear    Time 12    Period Weeks    Status New    Target Date 05/12/21      PT LONG TERM GOAL #4   Title diastasis  recti decreased to less than 1 fingers width with good contraction and resistance of tissue    Time 12    Period Weeks    Status New    Target Date 05/12/21                   Plan - 03/17/21 1026     Clinical Impression Statement Patient reports her urinary leakage is 75% better. She is dry one day out of the week. Patient Diastasis Rectis is 0 finger width by the end of the treatment. She was able to engage the upper and lower abdominals correctly and felt the difference. Her rib cage is able to move better. She is able to do diaphragmatic breathing correctly without hinging at the thoracolumbar junction. Patient does not need the tape today due to reduction of diastasis. Patient will benefit from skilled therapy to improve her core strength and rib mobiity to reduce her urinary leakage.    Personal Factors and Comorbidities Fitness;Sex;Comorbidity 1    Comorbidities vaginal birth on 11/30/2020 with second degree tear    Examination-Activity Limitations Continence   sneeze, cough   Examination-Participation Restrictions Interpersonal Relationship    Stability/Clinical Decision Making Stable/Uncomplicated    Rehab Potential Excellent    PT Frequency 1x / week   every other week   PT Duration Other (comment)   4 months   PT Treatment/Interventions ADLs/Self Care Home Management;Biofeedback;Cryotherapy;Electrical Stimulation;Moist Heat;Therapeutic activities;Therapeutic exercise;Neuromuscular re-education;Patient/family education;Manual techniques;Scar mobilization;Dry needling;Taping;Spinal Manipulations;Joint Manipulations    PT Next Visit Plan work on one legged with rotation activities, work on core engagement, check diastasis    PT Home Exercise Plan Access Code: [email or web address]    Consulted and Agree with Plan of Care Patient             Patient will benefit from skilled therapeutic intervention in order to improve the following deficits and impairments:  Decreased coordination,  Decreased range of motion, Increased fascial restricitons, Decreased endurance, Decreased activity  tolerance, Increased muscle spasms, Pain, Decreased strength, Decreased mobility  Visit Diagnosis: Muscle weakness (generalized)  Other lack of coordination  Stress incontinence (female) (female)  Cramp and spasm     Problem List Patient Active Problem List   Diagnosis Date Noted   Irritable bowel syndrome (IBS) 04/04/2015    Eulis Foster, PT 03/17/21 11:07 AM   [facility] [address] [place], [place], [address] Phone: [phone]   Fax:  [phone]  Name: Bianca Hodge MRN: [id number] Date of Birth: 07-08-1980

## 2021-03-28 ENCOUNTER — Encounter: Payer: No Typology Code available for payment source | Admitting: Physical Therapy

## 2021-04-14 ENCOUNTER — Encounter: Payer: Self-pay | Admitting: Physical Therapy

## 2021-04-14 ENCOUNTER — Other Ambulatory Visit: Payer: Self-pay

## 2021-04-14 ENCOUNTER — Ambulatory Visit: Payer: No Typology Code available for payment source | Attending: Certified Nurse Midwife | Admitting: Physical Therapy

## 2021-04-14 DIAGNOSIS — R252 Cramp and spasm: Secondary | ICD-10-CM | POA: Diagnosis present

## 2021-04-14 DIAGNOSIS — N393 Stress incontinence (female) (male): Secondary | ICD-10-CM | POA: Insufficient documentation

## 2021-04-14 DIAGNOSIS — M6281 Muscle weakness (generalized): Secondary | ICD-10-CM | POA: Insufficient documentation

## 2021-04-14 DIAGNOSIS — R278 Other lack of coordination: Secondary | ICD-10-CM | POA: Diagnosis present

## 2021-04-14 NOTE — Patient Instructions (Signed)
Access Code: [email or web address] URL: [email or web address] Date: 04/14/2021 Prepared by: Eulis Foster  Exercises Supine Transversus Abdominis Bracing - Hands on Stomach - 1 x daily - 7 x weekly - 1 sets - 10 reps - 5 sec hold Quadruped Transversus Abdominis Bracing - 1 x daily - 7 x weekly - 1 sets - 10 reps - 5 sec hold Quadruped Full Range Thoracic Rotation with Reach - 1 x daily - 7 x weekly - 1 sets - 10 reps Supine March - 1 x daily - 3 x weekly - 3 sets - 10 reps Side Plank on Knees - 1 x daily - 1 x weekly - 3 sets - 10 reps Quadruped Exhale with Pelvic Floor Contraction - 1 x daily - 7 x weekly - 3 sets - 10 reps Squat with Chair Touch - 1 x daily - 7 x weekly - 3 sets - 10 reps Standing Hip External Rotation at Wall - 1 x daily - 7 x weekly - 3 sets - 10 reps Standing Heel Raise with Hip and Knee Flexion at Wall - 1 x daily - 7 x weekly - 3 sets - 10 reps Standing Hip Hinge - 1 x daily - 7 x weekly - 3 sets - 10 reps Supine Bridge - 1 x daily - 7 x weekly - 3 sets - 10 reps  [facility] [address], [address] [place], [place] [address] Phone # [phone] Fax [phone]

## 2021-04-14 NOTE — Therapy (Addendum)
[facility] [place], [place], [address] Phone: [phone]   Fax:  [phone]  Physical Therapy Treatment  Patient Details  Name: Bianca Hodge MRN: [id number] Date of Birth: 01-Jun-1980 Referring Provider (PT): Vidalia R. Gilford Rile, CNM   Encounter Date: 04/14/2021   PT End of Session - 04/14/21 0910     Visit Number 4    Date for PT Re-Evaluation 05/12/21    Authorization Type UHC    Authorization - Visit Number 4    Authorization - Number of Visits 30    PT Start Time [phone]    PT Stop Time 0900    PT Time Calculation (min) 44 min    Activity Tolerance Patient tolerated treatment well    Behavior During Therapy [facility] for tasks assessed/performed             Past Medical History:  Diagnosis Date   Parasite infection 06-04-2014   Supervision of high risk pregnancy, antepartum 05/25/2020    Nursing Staff Provider  Office Location  [facility] Dating  LMP  Language  English Anatomy US  Normal - EFW 93%  Flu Vaccine  01/29/20 Genetic Screen  NIPS: normal female  AFP:  negative   TDaP Vaccine  09/29/20 Hgb A1C or  GTT Early  Third trimester   COVID Vaccine 08/13/19-09/03/19-04/15/20   LAB RESULTS   Rhogam   NA O+ Blood Type O/Positive/-- (12/29 1557)   Feeding Plan Breast Antibody Negative (12/29     Past Surgical History:  Procedure Laterality Date   APPENDECTOMY      There were no vitals filed for this visit.   Subjective Assessment - 04/14/21 0821     Subjective I have had a couple of days of a little leak like 2 days per week. I feel stronger in the core. I still do not do the crunches. I do an aerobic class 1 time per week.    Patient Stated Goals understand the correct exercises for the Diastasis; reduce the urinary leakage; return to prior exericse routine safely    Currently in Pain? No/denies    Multiple Pain Sites No                            Pelvic Floor Special Questions - 04/14/21  0001     Diastasis Recti no separation               [facility] Adult PT Treatment/Exercise - 04/14/21 0001       Lumbar Exercises: Supine   Bent Knee Raise 20 reps;1 second    Bent Knee Raise Limitations while holding her son and moving him up and down, side to side,    Bridge 15 reps;1 second    Bridge Limitations holding her son      Lumbar Exercises: Sidelying   Other Sidelying Lumbar Exercises side plank holding for 5 seconds while having her son in front of her      Lumbar Exercises: Quadruped   Other Quadruped Lumbar Exercises quadruped lifting knees and engaging abdominals      Knee/Hip Exercises: Standing   Functional Squat 10 reps    Functional Squat Limitations holding her sone with verbal cues for spinal neutral    Other Standing Knee Exercises standing hip external rotation isometric with engaging the gluteus medius and core while holding her son; holding her son facing the wall and flex one knee as she brings  sone up the wall in a controlled maner.    Other Standing Knee Exercises lunge forward with hip hinge and majority of weight on front leg holding her son to engage the core and gluteals                     PT Education - 04/14/21 0903     Education Details Access Code: [email or web address]    Person(s) Educated Patient    Methods Explanation;Demonstration;Verbal cues;Handout    Comprehension Returned demonstration;Verbalized understanding              PT Short Term Goals - 03/17/21 1105       PT SHORT TERM GOAL #1   Title independent with initial HEP    Time 4    Period Weeks    Status Achieved    Target Date 03/17/21      PT SHORT TERM GOAL #2   Title reduction of diastasis recti to 2 fingers width    Time 4    Period Weeks    Status Achieved      PT SHORT TERM GOAL #3   Title pelvis in correct alignment so she is able to engage the pelvic floor with circular pattern    Time 4    Period Weeks    Status Achieved               PT  Long Term Goals - 04/14/21 0920       PT LONG TERM GOAL #1   Title independent with HEP and understand how to progress herself safely without straining the diastasis recti    Time 12    Period Weeks    Status On-going      PT LONG TERM GOAL #2   Title urinary leakage improved >/= 75% due to pelvic floor strength improved >/= 1 muscle grade    Time 12    Period Weeks    Status On-going      PT LONG TERM GOAL #3   Title reduction of trigger points in the pelvic floor to have no discomfort with penile penetration or touching the area of the second degree tear    Time 12    Period Weeks    Status Achieved      PT LONG TERM GOAL #4   Title diastasis recti decreased to less than 1 fingers width with good contraction and resistance of tissue    Time 12    Period Weeks    Status Achieved                   Plan - 04/14/21 0824     Clinical Impression Statement Patient does not have a diastasis recti now. She is leaking urine 2 times per week and small amount. Patient program today incorporated exercises she can do with her son. She has learned how to engage her core, work on spinal neutral and control. Patient needed some verbal and tactile cues with the exercises. Patient is feeling stronger. Patient will benefit from skilled therapy to improve her core and pelvic strength to reduce urinary leakage.    Personal Factors and Comorbidities Fitness;Sex;Comorbidity 1    Comorbidities vaginal birth on 11/30/2020 with second degree tear    Examination-Activity Limitations Continence   sneeze, cough   Examination-Participation Restrictions Interpersonal Relationship    Stability/Clinical Decision Making Stable/Uncomplicated    Rehab Potential Excellent    PT Frequency 1x / week   every other week  PT Duration Other (comment)   4 months   PT Treatment/Interventions ADLs/Self Care Home Management;Biofeedback;Cryotherapy;Electrical Stimulation;Moist Heat;Therapeutic  activities;Therapeutic exercise;Neuromuscular re-education;Patient/family education;Manual techniques;Scar mobilization;Dry needling;Taping;Spinal Manipulations;Joint Manipulations    PT Next Visit Plan work on one legged with rotation activities, work on core engagement with exercises she can do with her son; tall kneel lean back; goblin squat; lift baby out of crib; get up from floor holding baby going into a lunge then stand; supine hands on wall alt hip flexion starting with them in the air and bil. hip ER    PT Home Exercise Plan Access Code: [email or web address]    Consulted and Agree with Plan of Care Patient             Patient will benefit from skilled therapeutic intervention in order to improve the following deficits and impairments:  Decreased coordination, Decreased range of motion, Increased fascial restricitons, Decreased endurance, Decreased activity tolerance, Increased muscle spasms, Pain, Decreased strength, Decreased mobility  Visit Diagnosis: Muscle weakness (generalized)  Other lack of coordination  Stress incontinence (female) (female)  Cramp and spasm     Problem List Patient Active Problem List   Diagnosis Date Noted   Irritable bowel syndrome (IBS) 04/04/2015    Earlie Counts, PT 04/14/21 9:21 AM  [facility] [place], [place], [address] Phone: [phone]   Fax:  [phone]  Name: Bianca Hodge MRN: [id number] Date of Birth: 1981-02-21  PHYSICAL THERAPY DISCHARGE SUMMARY  Visits from Start of Care: 4  Current functional level related to goals / functional outcomes: See above. Patient cancelled her appointment on 04/24/2021 due to her going out of town and feels her issues are resolved. She wants to be discharged.    Remaining deficits: See above.    Education / Equipment: HEP   Patient agrees to discharge. Patient goals were met. Patient is being discharged due to being pleased  with the current functional level. Thank you for the referral. Earlie Counts, PT 04/24/21 8:57 AM

## 2021-04-24 ENCOUNTER — Encounter: Payer: No Typology Code available for payment source | Admitting: Physical Therapy

## 2021-05-03 ENCOUNTER — Encounter: Payer: No Typology Code available for payment source | Admitting: Physical Therapy

## 2021-05-24 ENCOUNTER — Encounter: Payer: No Typology Code available for payment source | Admitting: Physical Therapy

## 2021-06-07 ENCOUNTER — Encounter: Payer: No Typology Code available for payment source | Admitting: Physical Therapy

## 2021-06-21 ENCOUNTER — Encounter: Payer: No Typology Code available for payment source | Admitting: Physical Therapy
